# Patient Record
Sex: Female | Born: 1970
Health system: Southern US, Community
[De-identification: ages and names within clinical notes are randomized; demographics above are authoritative.]

## PROBLEM LIST (undated history)

## (undated) DIAGNOSIS — F419 Anxiety disorder, unspecified: Secondary | ICD-10-CM

## (undated) DIAGNOSIS — G43909 Migraine, unspecified, not intractable, without status migrainosus: Secondary | ICD-10-CM

## (undated) DIAGNOSIS — F32A Depression, unspecified: Secondary | ICD-10-CM

## (undated) DIAGNOSIS — M1990 Unspecified osteoarthritis, unspecified site: Secondary | ICD-10-CM

## (undated) DIAGNOSIS — F329 Major depressive disorder, single episode, unspecified: Secondary | ICD-10-CM

## (undated) DIAGNOSIS — M797 Fibromyalgia: Secondary | ICD-10-CM

## (undated) DIAGNOSIS — J45909 Unspecified asthma, uncomplicated: Secondary | ICD-10-CM

## (undated) DIAGNOSIS — J449 Chronic obstructive pulmonary disease, unspecified: Secondary | ICD-10-CM

## (undated) HISTORY — PX: CHOLECYSTECTOMY: SHX55

## (undated) HISTORY — DX: Anxiety disorder, unspecified: F41.9

## (undated) HISTORY — PX: ROUX-EN-Y GASTRIC BYPASS: SHX1104

## (undated) HISTORY — DX: Depression, unspecified: F32.A

## (undated) HISTORY — DX: Migraine, unspecified, not intractable, without status migrainosus: G43.909

---
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
## 2007-05-20 ENCOUNTER — Ambulatory Visit: Payer: Self-pay | Admitting: Orthopedic Surgery

## 2007-05-26 ENCOUNTER — Ambulatory Visit (HOSPITAL_COMMUNITY): Admission: RE | Admit: 2007-05-26 | Discharge: 2007-05-26 | Payer: Self-pay | Admitting: Orthopedic Surgery

## 2007-05-27 ENCOUNTER — Ambulatory Visit (HOSPITAL_COMMUNITY): Admission: RE | Admit: 2007-05-27 | Discharge: 2007-05-27 | Payer: Self-pay | Admitting: Orthopedic Surgery

## 2007-05-28 ENCOUNTER — Ambulatory Visit (HOSPITAL_COMMUNITY): Admission: RE | Admit: 2007-05-28 | Discharge: 2007-05-28 | Payer: Self-pay | Admitting: Orthopedic Surgery

## 2007-06-24 ENCOUNTER — Emergency Department (HOSPITAL_COMMUNITY): Admission: EM | Admit: 2007-06-24 | Discharge: 2007-06-24 | Payer: Self-pay | Admitting: Emergency Medicine

## 2007-07-06 ENCOUNTER — Ambulatory Visit: Payer: Self-pay | Admitting: Orthopedic Surgery

## 2007-08-03 ENCOUNTER — Ambulatory Visit: Payer: Self-pay | Admitting: Orthopedic Surgery

## 2015-05-04 ENCOUNTER — Emergency Department (HOSPITAL_COMMUNITY)
Admission: EM | Admit: 2015-05-04 | Discharge: 2015-05-04 | Disposition: A | Discharge status: Home / Self Care | Payer: Medicaid Other | Attending: Emergency Medicine | Admitting: Emergency Medicine

## 2015-05-04 ENCOUNTER — Encounter (HOSPITAL_COMMUNITY): Payer: Self-pay | Admitting: Emergency Medicine

## 2015-05-04 DIAGNOSIS — M542 Cervicalgia: Secondary | ICD-10-CM

## 2015-05-04 DIAGNOSIS — M5090 Cervical disc disorder, unspecified, unspecified cervical region: Secondary | ICD-10-CM | POA: Diagnosis not present

## 2015-05-04 DIAGNOSIS — Z79899 Other long term (current) drug therapy: Secondary | ICD-10-CM | POA: Diagnosis not present

## 2015-05-04 DIAGNOSIS — M5030 Other cervical disc degeneration, unspecified cervical region: Secondary | ICD-10-CM

## 2015-05-04 MED ORDER — ONDANSETRON HCL 4 MG PO TABS
4.0000 mg | ORAL_TABLET | Freq: Once | ORAL | Status: AC
  Administered 2015-05-04: 4 mg via ORAL
  Filled 2015-05-04: qty 1

## 2015-05-04 MED ORDER — NAPROXEN 500 MG PO TABS: 500.0000 mg | ORAL_TABLET | Freq: Two times a day (BID) | ORAL | Status: DC

## 2015-05-04 MED ORDER — BACLOFEN 10 MG PO TABS: 10.0000 mg | ORAL_TABLET | Freq: Three times a day (TID) | ORAL | Status: AC

## 2015-05-04 MED ORDER — NAPROXEN 250 MG PO TABS
500.0000 mg | ORAL_TABLET | Freq: Once | ORAL | Status: AC
  Administered 2015-05-04: 500 mg via ORAL
  Filled 2015-05-04: qty 2

## 2015-05-04 MED ORDER — DIAZEPAM 5 MG PO TABS
5.0000 mg | ORAL_TABLET | Freq: Once | ORAL | Status: AC
  Administered 2015-05-04: 5 mg via ORAL
  Filled 2015-05-04: qty 1

## 2015-05-04 MED ORDER — ACETAMINOPHEN-CODEINE #3 300-30 MG PO TABS: 1.0000 | ORAL_TABLET | Freq: Four times a day (QID) | ORAL | Status: DC | PRN

## 2015-05-04 MED ORDER — ACETAMINOPHEN-CODEINE #3 300-30 MG PO TABS
2.0000 | ORAL_TABLET | Freq: Once | ORAL | Status: AC
  Administered 2015-05-04: 2 via ORAL
  Filled 2015-05-04: qty 2

## 2015-05-04 MED ORDER — PREDNISONE 10 MG PO TABS
60.0000 mg | ORAL_TABLET | Freq: Once | ORAL | Status: AC
  Administered 2015-05-04: 60 mg via ORAL
  Filled 2015-05-04 (×2): qty 1

## 2015-05-04 NOTE — ED Provider Notes (Signed)
CSN: [id number]     Arrival date & time 05/04/15  1816 History   First MD Initiated Contact with Patient 05/04/15 2016     Chief Complaint  Patient presents with  . Neck Pain     (Consider location/radiation/quality/duration/timing/severity/associated sxs/prior Treatment) Patient is a [age] y.o. female presenting with neck pain. The history is provided by the patient.  Neck Pain Pain location:  Generalized neck Quality:  Aching and stiffness Stiffness is present:  All day Pain radiates to:  L shoulder Pain severity:  Severe Pain is:  Same all the time Onset quality:  Gradual Timing:  Intermittent Progression:  Worsening Chronicity: acute on chronic. Context comment:  Startted a new job , has hx of DDD of c spine and osteoarthritis Relieved by:  Nothing Exacerbated by: movement. Associated symptoms: no bladder incontinence, no bowel incontinence, no numbness, no syncope and no weakness   Risk factors: no hx of spinal trauma, no recent epidural, no recent head injury and no recurrent falls     History reviewed. No pertinent past medical history. Past Surgical History  Procedure Laterality Date  . Cesarean section    . Cholecystectomy     History reviewed. No pertinent family history. History  Substance Use Topics  . Smoking status: Never Smoker   . Smokeless tobacco: Not on file  . Alcohol Use: No   OB History    No data available     Review of Systems  Cardiovascular: Negative for syncope.  Gastrointestinal: Negative for bowel incontinence.  Genitourinary: Negative for bladder incontinence.  Musculoskeletal: Positive for back pain, arthralgias and neck pain.  Neurological: Negative for weakness and numbness.  All other systems reviewed and are negative.     Allergies  Ephedrine and Aspirin  Home Medications   Prior to Admission medications   Medication Sig Start Date End Date Taking? Authorizing Provider  naproxen sodium (ANAPROX) 220 MG tablet Take 440 mg  by mouth daily as needed (FOR PAIN).   Yes Historical Provider, MD  TURMERIC PO Take 1 capsule by mouth daily.   Yes Historical Provider, MD   BP 141/71 mmHg  Pulse 79  Temp(Src) 98.2 F (36.8 C) (Oral)  Resp 22  Ht 5\' 8"  (1.727 m)  Wt 270 lb (122.471 kg)  BMI 41.06 kg/m2  SpO2 100%  LMP 04/12/2015 Physical Exam  Constitutional: She is oriented to person, place, and time. She appears well-developed and well-nourished.  Non-toxic appearance.  HENT:  Head: Normocephalic.  Right Ear: Tympanic membrane and external ear normal.  Left Ear: Tympanic membrane and external ear normal.  Eyes: EOM and lids are normal. Pupils are equal, round, and reactive to light.  Neck: Normal range of motion. Neck supple. Carotid bruit is not present.  Cardiovascular: Normal rate, regular rhythm, normal heart sounds, intact distal pulses and normal pulses.   Pulmonary/Chest: Breath sounds normal. No respiratory distress.  Abdominal: Soft. Bowel sounds are normal. There is no tenderness. There is no guarding.  Musculoskeletal:       Cervical back: She exhibits decreased range of motion, tenderness, pain and spasm. She exhibits no deformity.  Lymphadenopathy:       Head (right side): No submandibular adenopathy present.       Head (left side): No submandibular adenopathy present.    She has no cervical adenopathy.  Neurological: She is alert and oriented to person, place, and time. She has normal strength. No cranial nerve deficit or sensory deficit.  Grip symmetrical. No muscle atrophy noted.  No gross neuro deficit.  Skin: Skin is warm and dry.  Psychiatric: She has a normal mood and affect. Her speech is normal.  Nursing note and vitals reviewed.   ED Course  Procedures (including critical care time) Labs Review Labs Reviewed - No data to display  Imaging Review No results found.   EKG Interpretation None      MDM Vital signs are within normal limits. Pulse oximetry 100% on room air. No  gross neurologic deficits appreciated. Pain to palpation over the lower cervical spine. Pain to palpation of the upper trapezius area left more than right. Patient has a history of degenerative disc disease as well as osteoarthritis involving the spine.  The plan at this time is for the patient uses heating pad to the area. Prescription for Tylenol codeine, baclofen, and Naprosyn given to the patient. Patient states she is waiting for her Medicaid, or her work benefits to be released before she can go to see an orthopedic specialist. Patient advised to see the orthopedic specialist as sone as possible.    Final diagnoses:  None    *I have reviewed nursing notes, vital signs, and all appropriate lab and imaging results for this patient.[address]**    Kamarie Palma, PA-C 05/04/15 2053  Margarita Grizzleanielle Ray, MD 05/04/15 2138

## 2015-05-04 NOTE — Discharge Instructions (Signed)
Heating pad to your neck and shoulder may be helpful. Please use medications as prescribed. Tylenol codeine, and baclofen may cause drowsiness, please do not drink alcohol, drive equal, 3 machinery, or participate in activities requiring concentration when taking this medication. Musculoskeletal Pain Musculoskeletal pain is muscle and boney aches and pains. These pains can occur in any part of the body. Your caregiver may treat you without knowing the cause of the pain. They may treat you if blood or urine tests, X-rays, and other tests were normal.  CAUSES There is often not a definite cause or reason for these pains. These pains may be caused by a type of germ (virus). The discomfort may also come from overuse. Overuse includes working out too hard when your body is not fit. Boney aches also come from weather changes. Bone is sensitive to atmospheric pressure changes. HOME CARE INSTRUCTIONS   Ask when your test results will be ready. Make sure you get your test results.  Only take over-the-counter or prescription medicines for pain, discomfort, or fever as directed by your caregiver. If you were given medications for your condition, do not drive, operate machinery or power tools, or sign legal documents for 24 hours. Do not drink alcohol. Do not take sleeping pills or other medications that may interfere with treatment.  Continue all activities unless the activities cause more pain. When the pain lessens, slowly resume normal activities. Gradually increase the intensity and duration of the activities or exercise.  During periods of severe pain, bed rest may be helpful. Lay or sit in any position that is comfortable.  Putting ice on the injured area.  Put ice in a bag.  Place a towel between your skin and the bag.  Leave the ice on for 15 to 20 minutes, 3 to 4 times a day.  Follow up with your caregiver for continued problems and no reason can be found for the pain. If the pain becomes worse  or does not go away, it may be necessary to repeat tests or do additional testing. Your caregiver may need to look further for a possible cause. SEEK IMMEDIATE MEDICAL CARE IF:  You have pain that is getting worse and is not relieved by medications.  You develop chest pain that is associated with shortness or breath, sweating, feeling sick to your stomach (nauseous), or throw up (vomit).  Your pain becomes localized to the abdomen.  You develop any new symptoms that seem different or that concern you. MAKE SURE YOU:   Understand these instructions.  Will watch your condition.  Will get help right away if you are not doing well or get worse. Document Released: 12/16/2005 Document Revised: 03/09/2012 Document Reviewed: 08/20/2013 [facility] Patient Information 2015 [place], [place]. This information is not intended to replace advice given to you by your health care provider. Make sure you discuss any questions you have with your health care provider.

## 2015-05-04 NOTE — ED Notes (Signed)
Patient complaining of neck pain since last night. States she has history of same.

## 2015-09-09 ENCOUNTER — Encounter (HOSPITAL_COMMUNITY): Payer: Self-pay | Admitting: Emergency Medicine

## 2015-09-09 ENCOUNTER — Emergency Department (HOSPITAL_COMMUNITY)
Admission: EM | Admit: 2015-09-09 | Discharge: 2015-09-09 | Disposition: A | Discharge status: Home / Self Care | Payer: Medicaid Other | Attending: Emergency Medicine | Admitting: Emergency Medicine

## 2015-09-09 DIAGNOSIS — S0501XA Injury of conjunctiva and corneal abrasion without foreign body, right eye, initial encounter: Secondary | ICD-10-CM | POA: Insufficient documentation

## 2015-09-09 DIAGNOSIS — Z79899 Other long term (current) drug therapy: Secondary | ICD-10-CM | POA: Diagnosis not present

## 2015-09-09 DIAGNOSIS — Z791 Long term (current) use of non-steroidal anti-inflammatories (NSAID): Secondary | ICD-10-CM | POA: Insufficient documentation

## 2015-09-09 DIAGNOSIS — Y9289 Other specified places as the place of occurrence of the external cause: Secondary | ICD-10-CM | POA: Insufficient documentation

## 2015-09-09 DIAGNOSIS — Y9389 Activity, other specified: Secondary | ICD-10-CM | POA: Diagnosis not present

## 2015-09-09 DIAGNOSIS — W228XXA Striking against or struck by other objects, initial encounter: Secondary | ICD-10-CM | POA: Insufficient documentation

## 2015-09-09 DIAGNOSIS — S0591XA Unspecified injury of right eye and orbit, initial encounter: Secondary | ICD-10-CM | POA: Diagnosis present

## 2015-09-09 DIAGNOSIS — Y998 Other external cause status: Secondary | ICD-10-CM | POA: Diagnosis not present

## 2015-09-09 MED ORDER — TOBRAMYCIN 0.3 % OP SOLN
2.0000 [drp] | Freq: Once | OPHTHALMIC | Status: AC
  Administered 2015-09-09: 2 [drp] via OPHTHALMIC
  Filled 2015-09-09: qty 5

## 2015-09-09 MED ORDER — TETANUS-DIPHTH-ACELL PERTUSSIS 5-2.5-18.5 LF-MCG/0.5 IM SUSP
0.5000 mL | Freq: Once | INTRAMUSCULAR | Status: AC
  Administered 2015-09-09: 0.5 mL via INTRAMUSCULAR
  Filled 2015-09-09: qty 0.5

## 2015-09-09 MED ORDER — TETRACAINE HCL 0.5 % OP SOLN
2.0000 [drp] | Freq: Once | OPHTHALMIC | Status: AC
  Administered 2015-09-09: 2 [drp] via OPHTHALMIC
  Filled 2015-09-09: qty 2

## 2015-09-09 MED ORDER — FLUORESCEIN SODIUM 1 MG OP STRP
1.0000 | ORAL_STRIP | Freq: Once | OPHTHALMIC | Status: AC
  Administered 2015-09-09: 1 via OPHTHALMIC

## 2015-09-09 NOTE — ED Notes (Signed)
PA at bedside.

## 2015-09-09 NOTE — ED Notes (Addendum)
Reports of unknown object going into right eye about 30 minutes ago. States someone was showing her how to use leaf blower during time of injury. Redness noted to right eye. Denies blurry vision.

## 2015-09-09 NOTE — ED Notes (Signed)
Pt states something was blown into her eye with a leaf blower and it still feels "irritated".

## 2015-09-09 NOTE — Discharge Instructions (Signed)
Please use  2 tobramycin eyedrops every 4 hours to the right eye for the next 5 days. Please use cold compresses. Please use dark glasses and a hat with brim until the discomfort has subsided. Please see your eye specialist as scheduled. Please adjust your medical records that you received tetanus update today. Corneal Abrasion The cornea is the clear covering at the front and center of the eye. When you look at the colored portion of the eye, you are looking through the cornea. It is a thin tissue made up of layers. The top layer is the most sensitive layer. A corneal abrasion happens if this layer is scratched or an injury causes it to come off.  HOME CARE  You may be given drops or a medicated cream. Use the medicine as told by your doctor.  A pressure patch may be put over the eye. If this is done, follow your doctor's instructions for when to remove the patch. Do not drive or use machines while the eye patch is on. Judging distances is hard to do with a patch on.  See your doctor for a follow-up exam if you are told to do so. It is very important that you keep this appointment. GET HELP IF:   You have pain, are sensitive to light, and have a scratchy feeling in one eye or both eyes.  Your pressure patch keeps getting loose. You can blink your eye under the patch.  You have fluid coming from your eye or the lids stick together in the morning.  You have the same symptoms in the morning that you did with the first abrasion. This could be days, weeks, or months after the first abrasion healed. MAKE SURE YOU:   Understand these instructions.  Will watch your condition.  Will get help right away if you are not doing well or get worse. Document Released: 06/03/2008 Document Revised: 10/06/2013 Document Reviewed: 08/23/2013 [facility] Patient Information 2015 [place], [place]. This information is not intended to replace advice given to you by your health care provider. Make sure you discuss any  questions you have with your health care provider.

## 2015-09-09 NOTE — ED Provider Notes (Signed)
CSN: [id number]     Arrival date & time 09/09/15  2042 History   First MD Initiated Contact with Patient 09/09/15 2050     Chief Complaint  Patient presents with  . Eye Pain     (Consider location/radiation/quality/duration/timing/severity/associated sxs/prior Treatment) HPI Comments: Someone turned on a leaf blower near the patient and a foreign object blew into the right eye. Pt flushed the eye, but continues to have a foreign body sensation in the eye. There is also some pain present. Pt not wearing contacts. No hx of glaucoma. No bleeding from the eye. No bluring or decreased vision. Pt acknowledges that she needs to see an eye specialist concerning her vision.  The history is provided by the patient.    History reviewed. No pertinent past medical history. Past Surgical History  Procedure Laterality Date  . Cesarean section    . Cholecystectomy     History reviewed. No pertinent family history. Social History  Substance Use Topics  . Smoking status: Never Smoker   . Smokeless tobacco: None  . Alcohol Use: No   OB History    No data available     Review of Systems  Constitutional: Negative for activity change.       All ROS Neg except as noted in HPI  HENT: Negative for nosebleeds.   Eyes: Positive for photophobia, pain and redness. Negative for discharge.  Respiratory: Negative for cough, shortness of breath and wheezing.   Cardiovascular: Negative for chest pain and palpitations.  Gastrointestinal: Negative for abdominal pain and blood in stool.  Genitourinary: Negative for dysuria, frequency and hematuria.  Musculoskeletal: Negative for back pain, arthralgias and neck pain.  Skin: Negative.   Neurological: Negative for dizziness, seizures and speech difficulty.  Psychiatric/Behavioral: Negative for hallucinations and confusion.      Allergies  Ephedrine and Aspirin  Home Medications   Prior to Admission medications   Medication Sig Start Date End Date  Taking? Authorizing Provider  acetaminophen-codeine (TYLENOL #3) 300-30 MG per tablet Take 1-2 tablets by mouth every 6 (six) hours as needed. 05/04/15   Ivery Quale, PA-C  naproxen (NAPROSYN) 500 MG tablet Take 1 tablet (500 mg total) by mouth 2 (two) times daily. 05/04/15   Ivery Quale, PA-C  naproxen sodium (ANAPROX) 220 MG tablet Take 440 mg by mouth daily as needed (FOR PAIN).    Historical Provider, MD  TURMERIC PO Take 1 capsule by mouth daily.    Historical Provider, MD   BP 125/92 mmHg  Pulse 69  Temp(Src) 97.9 F (36.6 C)  Resp 18  Ht 5\' 7"  (1.702 m)  Wt 295 lb (133.811 kg)  BMI 46.19 kg/m2  SpO2 100% Physical Exam  Constitutional: She is oriented to person, place, and time. She appears well-developed and well-nourished.  Non-toxic appearance.  HENT:  Head: Normocephalic.  Right Ear: Tympanic membrane and external ear normal.  Left Ear: Tympanic membrane and external ear normal.  Eyes: EOM and lids are normal. Pupils are equal, round, and reactive to light. Right eye exhibits no discharge and no hordeolum. No foreign body present in the right eye. Left eye exhibits no discharge and no hordeolum. No foreign body present in the left eye. Right conjunctiva is injected. Left conjunctiva is not injected. No scleral icterus.  Fundoscopic exam:      The right eye shows no exudate, no hemorrhage and no papilledema.       The left eye shows no exudate, no hemorrhage and no papilledema.  Slit  lamp exam:      The right eye shows corneal abrasion. The right eye shows no corneal ulcer, no foreign body, no hyphema and no anterior chamber bulge.    Neck: Normal range of motion. Neck supple. Carotid bruit is not present.  Cardiovascular: Normal rate, regular rhythm, normal heart sounds, intact distal pulses and normal pulses.   Pulmonary/Chest: Breath sounds normal. No respiratory distress.  Abdominal: Soft. Bowel sounds are normal. There is no tenderness. There is no guarding.   Musculoskeletal: Normal range of motion.  Lymphadenopathy:       Head (right side): No submandibular adenopathy present.       Head (left side): No submandibular adenopathy present.    She has no cervical adenopathy.  Neurological: She is alert and oriented to person, place, and time. She has normal strength. No cranial nerve deficit or sensory deficit.  Skin: Skin is warm and dry.  Psychiatric: She has a normal mood and affect. Her speech is normal.  Nursing note and vitals reviewed.   ED Course  Procedures (including critical care time) Labs Review Labs Reviewed - No data to display  Imaging Review No results found. I have personally reviewed and evaluated these images and lab results as part of my medical decision-making.   EKG Interpretation None      MDM  There is a shallow abrasion present at the 2:00 position. The patient will be treated with tobramycin drops. The tetanus status was updated. Patient was advised to use cool compresses over the next few days. She will use the tobramycin every 4 hours for the next 5 days. She is scheduled to see her eye specialist this week.    Final diagnoses:  None    **I have reviewed nursing notes, vital signs, and all appropriate lab and imaging results for this patient.Ivery Quale, PA-C 09/09/15 2140  Ivery Quale, PA-C 09/15/15 2239  Vanetta Mulders, MD 09/20/15 2055

## 2016-09-14 ENCOUNTER — Emergency Department (HOSPITAL_COMMUNITY): Payer: Medicaid Other

## 2016-09-14 ENCOUNTER — Emergency Department (HOSPITAL_COMMUNITY)
Admission: EM | Admit: 2016-09-14 | Discharge: 2016-09-14 | Disposition: A | Discharge status: Home / Self Care | Payer: Medicaid Other | Attending: Emergency Medicine | Admitting: Emergency Medicine

## 2016-09-14 ENCOUNTER — Encounter (HOSPITAL_COMMUNITY): Payer: Self-pay | Admitting: Emergency Medicine

## 2016-09-14 DIAGNOSIS — S93401A Sprain of unspecified ligament of right ankle, initial encounter: Secondary | ICD-10-CM | POA: Insufficient documentation

## 2016-09-14 DIAGNOSIS — Y939 Activity, unspecified: Secondary | ICD-10-CM | POA: Insufficient documentation

## 2016-09-14 DIAGNOSIS — S63501A Unspecified sprain of right wrist, initial encounter: Secondary | ICD-10-CM

## 2016-09-14 DIAGNOSIS — Y929 Unspecified place or not applicable: Secondary | ICD-10-CM | POA: Insufficient documentation

## 2016-09-14 DIAGNOSIS — S8001XA Contusion of right knee, initial encounter: Secondary | ICD-10-CM | POA: Insufficient documentation

## 2016-09-14 DIAGNOSIS — W1809XA Striking against other object with subsequent fall, initial encounter: Secondary | ICD-10-CM | POA: Insufficient documentation

## 2016-09-14 DIAGNOSIS — Y999 Unspecified external cause status: Secondary | ICD-10-CM | POA: Insufficient documentation

## 2016-09-14 MED ORDER — FOLDING WALKER MISC: 1.0000 | Freq: Once | 0 refills | Status: AC

## 2016-09-14 NOTE — Discharge Instructions (Signed)
Wear your ASO and use the walker to help minimize weight bearing.  Use ice and elevation as much as possible for the next several days for your ankle, knee and wrist to help reduce the swelling.   Use your naproxen also for inflammation.  Call the orthopedic doctor listed for a recheck of your injury in the next 10 days if not improving (or the doctor referred by your employer).   You may benefit from physical therapy if your injuries are not improving over the next 7-10 days.

## 2016-09-14 NOTE — ED Triage Notes (Addendum)
PT states her right ankle rolled this am causing her to fall to the right side. PT c/o right knee and ankle pain since fall at 1030 this am. PT also c/o right hand pain as well on ROM.

## 2016-09-15 NOTE — ED Provider Notes (Signed)
[facility] DEPT Provider Note   CSN: [id number] Arrival date & time: 09/14/16  1155     History   Chief Complaint Chief Complaint  Patient presents with  . Fall    HPI Delta Pichon is a 45 y.o. female.  The history is provided by the patient.  Fall  This is a new problem. 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 The problem occurs constantly. The problem has been gradually worsening. Pertinent negatives include no chest pain, no abdominal pain and no headaches. Associated symptoms comments: She denies head injury, headache, neck or back pain.. The symptoms are aggravated by standing. Nothing relieves the symptoms. She has tried nothing for the symptoms.    History reviewed. No pertinent past medical history.  There are no active problems to display for this patient.   Past Surgical History:  Procedure Laterality Date  . CESAREAN SECTION    . CHOLECYSTECTOMY      OB History    No data available       Home Medications    Prior to Admission medications   Medication Sig Start Date End Date Taking? Authorizing Provider  acetaminophen-codeine (TYLENOL #3) 300-30 MG per tablet Take 1-2 tablets by mouth every 6 (six) hours as needed. Patient not taking: Reported on 09/14/2016 05/04/15   Ivery Quale, PA-C  naproxen (NAPROSYN) 500 MG tablet Take 1 tablet (500 mg total) by mouth 2 (two) times daily. Patient not taking: Reported on 09/14/2016 05/04/15   Ivery Quale, PA-C  naproxen sodium (ANAPROX) 220 MG tablet Take 440 mg by mouth daily as needed (FOR PAIN).    Historical Provider, MD    Family History History reviewed. No pertinent family history.  Social History Social History  Substance Use Topics  . Smoking status: Never Smoker  . Smokeless tobacco: Never Used  . Alcohol use No     Allergies   Ephedrine and Aspirin   Review  of Systems Review of Systems  Constitutional: Negative for fever.  Cardiovascular: Negative for chest pain.  Gastrointestinal: Negative for abdominal pain.  Musculoskeletal: Positive for arthralgias and joint swelling. Negative for myalgias.  Neurological: Negative for weakness, numbness and headaches.     Physical Exam Updated Vital Signs BP 150/88 (BP Location: Right Wrist)   Pulse 85   Temp 98.7 F (37.1 C) (Oral)   Resp 20   Ht 5\' 7"  (1.702 m)   Wt 133.8 kg   LMP 09/01/2016   SpO2 100%   BMI 46.20 kg/m   Physical Exam  Constitutional: She appears well-developed and well-nourished.  HENT:  Head: Normocephalic.  Cardiovascular: Normal rate and intact distal pulses.  Exam reveals no decreased pulses.   Pulses:      Dorsalis pedis pulses are 2+ on the right side, and 2+ on the left side.       Posterior tibial pulses are 2+ on the right side, and 2+ on the left side.  Musculoskeletal: She exhibits edema and tenderness.       Right knee: She exhibits normal range of motion, no swelling, no effusion, no ecchymosis, no deformity, normal alignment, no LCL laxity, normal meniscus and no MCL laxity. Tenderness found. Lateral joint line tenderness noted.       Right ankle: She exhibits decreased range of motion and swelling. She exhibits no ecchymosis and normal pulse. Tenderness. Lateral malleolus tenderness found.  No head of 5th metatarsal and no proximal fibula tenderness found. Achilles tendon normal.       Right hand: She exhibits bony tenderness. She exhibits normal range of motion, normal capillary refill and no deformity. Normal sensation noted.       Hands: No snuffbox tenderness.  Neurological: She is alert. No sensory deficit.  Skin: Skin is warm, dry and intact.  Nursing note and vitals reviewed.    ED Treatments / Results  Labs (all labs ordered are listed, but only abnormal results are displayed) Labs Reviewed - No data to display  EKG  EKG  Interpretation None       Radiology Dg Ankle Complete Right  Result Date: 09/14/2016 CLINICAL DATA:  Patient status post fall. Ankle pain. Initial encounter. EXAM: RIGHT ANKLE - COMPLETE 3+ VIEW COMPARISON:  None. FINDINGS: Normal anatomic alignment. No evidence for acute fracture or dislocation. Plantar calcaneal spurring. Regional soft tissues are unremarkable. Focal areas of osseous sclerosis within the distal fibula. IMPRESSION: No acute osseous abnormality. Focal area of osseous sclerosis within the distal fibula, potentially representing a large bone island. Recommend follow-up ankle radiograph in 6 months to ensure stability. Electronically Signed   By: Annia Beltrew  Davis M.D.   On: 09/14/2016 13:07   Dg Knee Complete 4 Views Right  Result Date: 09/14/2016 CLINICAL DATA:  Patient status post fall. Right ankle and knee pain. No prior injury. Initial encounter. EXAM: RIGHT KNEE - COMPLETE 4+ VIEW COMPARISON:  None. FINDINGS: Normal anatomic alignment. No evidence for acute fracture or dislocation. Regional soft tissues are unremarkable. No joint effusion. IMPRESSION: No acute osseous abnormality. Electronically Signed   By: Annia Beltrew  Davis M.D.   On: 09/14/2016 13:03   Dg Hand Complete Right  Result Date: 09/14/2016 CLINICAL DATA:  Patient status post fall.  Right hand pain. EXAM: RIGHT HAND - COMPLETE 3+ VIEW COMPARISON:  None. FINDINGS: Normal anatomic alignment. No evidence for acute fracture dislocation. Regional soft tissues are unremarkable. IMPRESSION: No acute osseous abnormality. Electronically Signed   By: Annia Beltrew  Davis M.D.   On: 09/14/2016 13:05    Procedures Procedures (including critical care time)  Medications Ordered in ED Medications - No data to display   Initial Impression / Assessment and Plan / ED Course  I have reviewed the triage vital signs and the nursing notes.  Pertinent labs & imaging results that were available during my care of the patient were reviewed by me and  considered in my medical decision making (see chart for details).  Clinical Course    Pt placed in aso, velcro wrist splint. RICE, prn walker prescribed. Plan f/u with ortho in 10 days if not improving, or workers comp MD since this was a work related injury.  Final Clinical Impressions(s) / ED Diagnoses   Final diagnoses:  Ankle sprain, right, initial encounter  Wrist sprain, right, initial encounter  Knee contusion, right, initial encounter    New Prescriptions Discharge Medication List as of 09/14/2016  1:25 PM    START taking these medications   Details  Misc. Devices [facility] [place] MISC 1 Device by Does not apply route once., Starting Sat 09/14/2016, Print         Burgess AmorJulie Lovina Zuver, PA-C 09/15/16 [id number]    Vanetta MuldersScott Zackowski, MD 09/15/16 2127

## 2017-07-03 ENCOUNTER — Emergency Department (HOSPITAL_COMMUNITY)
Admission: EM | Admit: 2017-07-03 | Discharge: 2017-07-04 | Disposition: A | Discharge status: Home / Self Care | Payer: Medicaid Other | Attending: Emergency Medicine | Admitting: Emergency Medicine

## 2017-07-03 ENCOUNTER — Encounter (HOSPITAL_COMMUNITY): Payer: Self-pay | Admitting: Emergency Medicine

## 2017-07-03 ENCOUNTER — Emergency Department (HOSPITAL_COMMUNITY): Payer: Medicaid Other

## 2017-07-03 DIAGNOSIS — W010XXA Fall on same level from slipping, tripping and stumbling without subsequent striking against object, initial encounter: Secondary | ICD-10-CM | POA: Insufficient documentation

## 2017-07-03 DIAGNOSIS — W19XXXA Unspecified fall, initial encounter: Secondary | ICD-10-CM

## 2017-07-03 DIAGNOSIS — M79604 Pain in right leg: Secondary | ICD-10-CM | POA: Insufficient documentation

## 2017-07-03 DIAGNOSIS — Y999 Unspecified external cause status: Secondary | ICD-10-CM | POA: Insufficient documentation

## 2017-07-03 DIAGNOSIS — Y9301 Activity, walking, marching and hiking: Secondary | ICD-10-CM | POA: Insufficient documentation

## 2017-07-03 DIAGNOSIS — Y9289 Other specified places as the place of occurrence of the external cause: Secondary | ICD-10-CM | POA: Insufficient documentation

## 2017-07-03 NOTE — ED Triage Notes (Addendum)
Pt c/o right knee down to right ankle pain since fall Friday. Pt is ambulatory in triage

## 2017-07-04 NOTE — Discharge Instructions (Signed)
Apply Ace wrap and the immobilizer as needed for pain and swelling. Elevate the extremity or use compression stockings when possible. Alternate 600 mg of ibuprofen and 500-1000mg  of Tylenol every 3 hours as needed for pain for the next few days. Apply ice or heat to the affected area for comfort. Follow-up with your primary care physician for reevaluation. Return to the ED if any concerning signs or symptoms develop such as worsening swelling, redness, severe pain.

## 2017-07-04 NOTE — ED Provider Notes (Signed)
[facility] DEPT Provider Note   CSN: [id number] Arrival date & time: 07/03/17  2002     History   Chief Complaint Chief Complaint  Patient presents with  . Leg Pain    HPI  Heather Mckinney is a 46 y.o. female who presents today with chief complaint acute onset, persisting right lower extremity pain for 6 days secondary to fall. Patient states that on Friday she misstepped in a parking lot and fell on her bilateral knees. Denies head injury or loss of consciousness. Since then she has had swelling, ecchymosis, and pain to the anterior aspect of the right knee radiating down the right shin into the ankle and toes. She notes pain is aching and throbbing, worsens with standing, palpation, and bending. She endorses intermittent numbness and tingling into her toes and the dorsum of her foot. She has been taking Aleve with mild relief.  The history is provided by the patient.    History reviewed. No pertinent past medical history.  There are no active problems to display for this patient.   Past Surgical History:  Procedure Laterality Date  . CESAREAN SECTION    . CHOLECYSTECTOMY      OB History    No data available       Home Medications    Prior to Admission medications   Medication Sig Start Date End Date Taking? Authorizing Provider  acetaminophen-codeine (TYLENOL #3) 300-30 MG per tablet Take 1-2 tablets by mouth every 6 (six) hours as needed. Patient not taking: Reported on 09/14/2016 05/04/15   Ivery Quale, PA-C  naproxen (NAPROSYN) 500 MG tablet Take 1 tablet (500 mg total) by mouth 2 (two) times daily. Patient not taking: Reported on 09/14/2016 05/04/15   Ivery Quale, PA-C  naproxen sodium (ANAPROX) 220 MG tablet Take 440 mg by mouth daily as needed (FOR PAIN).    [provider]    Family History History reviewed. No pertinent family history.  Social History Social History  Substance Use Topics  . Smoking status: Never Smoker  . Smokeless  tobacco: Never Used  . Alcohol use No     Allergies   Ephedrine and Aspirin   Review of Systems Review of Systems  Constitutional: Negative for chills and fever.  Musculoskeletal: Positive for arthralgias, joint swelling and myalgias. Negative for back pain and neck pain.  Neurological: Positive for numbness. Negative for syncope and weakness.     Physical Exam Updated Vital Signs BP (!) 155/94 (BP Location: Right Wrist)   Pulse 79   Temp 97.7 F (36.5 C) (Oral)   Resp 20   Ht 5\' 7"  (1.702 m)   Wt 135.2 kg (298 lb)   LMP 06/11/2017   SpO2 98%   BMI 46.67 kg/m   Physical Exam  Constitutional: She is oriented to person, place, and time. She appears well-developed and well-nourished.  HENT:  Head: Normocephalic and atraumatic.  Eyes: Conjunctivae and EOM are normal. Pupils are equal, round, and reactive to light. Right eye exhibits no discharge. Left eye exhibits no discharge.  Neck: Normal range of motion. Neck supple. No JVD present. No tracheal deviation present.  Cardiovascular: Normal rate and intact distal pulses.   DP/PT pulses of the right foot detected via Doppler. 2+ DP/PT pulses of the left foot. Negative Homans bilaterally, no palpable cords or erythema or swelling posteriorly  Pulmonary/Chest: Effort normal.  Abdominal: Soft. She exhibits no distension. There is no tenderness.  Musculoskeletal: She exhibits edema and tenderness.  Right knee: She exhibits decreased range of motion, swelling and ecchymosis. She exhibits no deformity, no laceration, no erythema, normal alignment, no LCL laxity, normal meniscus and no MCL laxity. Tenderness found. LCL tenderness noted. No medial joint line, no lateral joint line and no MCL tenderness noted.       Right ankle: She exhibits decreased range of motion, swelling and ecchymosis. She exhibits no deformity, no laceration and normal pulse. Tenderness. Lateral malleolus and medial malleolus tenderness found. Achilles  tendon exhibits no pain.  Large areas of ecchymosis noted to the right knee, anterior shin, and ankle inferior to the malleoli. There is swelling of the right ankle laterally. 5/5 strength of BLE major muscle groups, however pain elicited with flexion and extension of the ankle and knee. Diffusely tender to palpation from the knee down to the dorsum of the foot.  Neurological: She is alert and oriented to person, place, and time.  Fluent speech, no facial droop, sensation intact to light touch of bilateral lower extremities. Antalgic gait.  Skin: Skin is warm and dry. Capillary refill takes less than 2 seconds.  Psychiatric: She has a normal mood and affect. Her behavior is normal.     ED Treatments / Results  Labs (all labs ordered are listed, but only abnormal results are displayed) Labs Reviewed - No data to display  EKG  EKG Interpretation None       Radiology Dg Tibia/fibula Right  Result Date: 07/03/2017 CLINICAL DATA:  Right knee, lower leg and ankle pain after fall 6 days prior. Bruising. EXAM: RIGHT TIBIA AND FIBULA - 2 VIEW COMPARISON:  Ankle radiograph 09/14/2016 FINDINGS: There is no evidence of acute fracture. Cortical margins of the tibia and fibula are intact. Sclerotic density in the distal fibula may be a bone island or sequela of nonossifying fibroma, stable from prior exam. Soft tissue edema of the lower leg versus habitus. IMPRESSION: No acute fracture of the right lower leg. Electronically Signed   By: Rubye OaksMelanie  Ehinger M.D.   On: 07/03/2017 21:25   Dg Ankle Complete Right  Result Date: 07/03/2017 CLINICAL DATA:  Right knee, lower leg and ankle pain after fall 6 days prior. Bruising. EXAM: RIGHT ANKLE - COMPLETE 3+ VIEW COMPARISON:  Ankle radiographs 09/14/2016 FINDINGS: There is no evidence of fracture, dislocation, or joint effusion. Sclerotic density in the distal fibula is unchanged from prior exam. Ankle mortise is preserved. Soft tissue edema versus habitus.  IMPRESSION: 1. No fracture dislocation of a right ankle. 2. Stable sclerotic density in the distal fibula, may be a bone island or sequela of nonossifying fibroma. This is stable from prior exam. No further follow-up is needed. Electronically Signed   By: Rubye OaksMelanie  Ehinger M.D.   On: 07/03/2017 21:26   Dg Knee Complete 4 Views Right  Result Date: 07/03/2017 CLINICAL DATA:  Right knee, lower leg and ankle pain after fall 6 days prior. Bruising. EXAM: RIGHT KNEE - COMPLETE 4+ VIEW COMPARISON:  Right knee radiographs 09/14/2016 FINDINGS: No fracture or dislocation. Small knee joint effusion. Mild tricompartmental osteoarthritis with peripheral spurring and spurring the tibial spines. Alignment is maintained. Soft tissue edema versus habitus. IMPRESSION: No fracture or dislocation of the right knee. Mild tricompartmental osteoarthritis, unchanged from prior exam. Small joint effusion. Electronically Signed   By: Rubye OaksMelanie  Ehinger M.D.   On: 07/03/2017 21:23    Procedures Procedures (including critical care time)  Medications Ordered in ED Medications - No data to display   Initial Impression / Assessment and  Plan / ED Course  I have reviewed the triage vital signs and the nursing notes.  Pertinent labs & imaging results that were available during my care of the patient were reviewed by me and considered in my medical decision making (see chart for details).     Patient with ongoing pain, ecchymosis, and swelling secondary to fall 6 days ago. Neurovascularly intact. Imaging reviewed by me shows no acute fracture or or dislocation but do show stable arthritic changes and a small joint effusion in the right knee. Low suspicion of DVT in the absence of risk factors. Suspect patient has a deep contusion, worsened with long hours standing while at work. RICE therapy indicated and discussed, applied Ace wrap to ankle and compression stockings and discussed symptomatic pain management with ibuprofen and  Tylenol. Patient is ambulatory but with pain. Recommend follow-up with primary care or orthopedics in one week if symptoms persist. Discussed indications for return to the ED immediately. Pt verbalized understanding of and agreement with plan and is safe for discharge home at this time.  Final Clinical Impressions(s) / ED Diagnoses   Final diagnoses:  Fall, initial encounter  Pain of right anterior lower extremity    New Prescriptions Discharge Medication List as of 07/04/2017 12:31 AM       Jeanie Sewer, PA-C 07/04/17 1619    Glynn Octave, MD 07/04/17 2040

## 2017-09-12 ENCOUNTER — Emergency Department (HOSPITAL_COMMUNITY)
Admission: EM | Admit: 2017-09-12 | Discharge: 2017-09-12 | Disposition: A | Discharge status: Home / Self Care | Payer: Self-pay | Attending: Emergency Medicine | Admitting: Emergency Medicine

## 2017-09-12 ENCOUNTER — Emergency Department (HOSPITAL_COMMUNITY): Payer: Self-pay

## 2017-09-12 ENCOUNTER — Encounter (HOSPITAL_COMMUNITY): Payer: Self-pay | Admitting: Emergency Medicine

## 2017-09-12 DIAGNOSIS — J449 Chronic obstructive pulmonary disease, unspecified: Secondary | ICD-10-CM | POA: Insufficient documentation

## 2017-09-12 DIAGNOSIS — J209 Acute bronchitis, unspecified: Secondary | ICD-10-CM | POA: Insufficient documentation

## 2017-09-12 HISTORY — DX: Chronic obstructive pulmonary disease, unspecified: J44.9

## 2017-09-12 MED ORDER — PREDNISONE 10 MG PO TABS: 20.0000 mg | ORAL_TABLET | Freq: Every day | ORAL | 0 refills | Status: DC

## 2017-09-12 MED ORDER — IPRATROPIUM-ALBUTEROL 0.5-2.5 (3) MG/3ML IN SOLN
3.0000 mL | Freq: Once | RESPIRATORY_TRACT | Status: AC
  Administered 2017-09-12: 3 mL via RESPIRATORY_TRACT
  Filled 2017-09-12: qty 3

## 2017-09-12 MED ORDER — PREDNISONE 50 MG PO TABS
60.0000 mg | ORAL_TABLET | Freq: Once | ORAL | Status: AC
  Administered 2017-09-12: 60 mg via ORAL
  Filled 2017-09-12: qty 1

## 2017-09-12 MED ORDER — ALBUTEROL SULFATE HFA 108 (90 BASE) MCG/ACT IN AERS: 2.0000 | INHALATION_SPRAY | RESPIRATORY_TRACT | 1 refills | Status: DC | PRN

## 2017-09-12 MED ORDER — AZITHROMYCIN 250 MG PO TABS: 250.0000 mg | ORAL_TABLET | Freq: Every day | ORAL | 0 refills | Status: DC

## 2017-09-12 NOTE — ED Provider Notes (Signed)
[facility] Provider Note   CSN: [id number] Arrival date & time: 09/12/17  1222     History   Chief Complaint Chief Complaint  Patient presents with  . Cough    HPI See Beharry is a 46 y.o. female.  The history is provided by the patient.  Cough  This is a new problem. The current episode started more than 2 days ago. The problem occurs constantly. The problem has been gradually worsening. The cough is productive of sputum. There has been no fever. Associated symptoms include shortness of breath. She has tried decongestants for the symptoms. She is not a smoker. Her past medical history is significant for bronchitis.  Pt complains of a cough and congestion.  Pt has had bronchitis in the past  Past Medical History:  Diagnosis Date  . COPD (chronic obstructive pulmonary disease) (HCC)     There are no active problems to display for this patient.   Past Surgical History:  Procedure Laterality Date  . CESAREAN SECTION    . CHOLECYSTECTOMY      OB History    No data available       Home Medications    Prior to Admission medications   Medication Sig Start Date End Date Taking? Authorizing Provider  naproxen sodium (ANAPROX) 220 MG tablet Take 440 mg by mouth daily as needed (FOR PAIN).   Yes [provider]  albuterol (PROVENTIL HFA;VENTOLIN HFA) 108 (90 Base) MCG/ACT inhaler Inhale 2 puffs into the lungs every 4 (four) hours as needed for wheezing or shortness of breath. 09/12/17   Elson Areas, PA-C  azithromycin (ZITHROMAX) 250 MG tablet Take 1 tablet (250 mg total) by mouth daily. Take first 2 tablets together, then 1 every day until finished. 09/12/17   Elson Areas, PA-C  predniSONE (DELTASONE) 10 MG tablet Take 2 tablets (20 mg total) by mouth daily. 09/12/17   Elson Areas, PA-C    Family History No family history on file.  Social History Social History  Substance Use Topics  . Smoking status: Never Smoker  . Smokeless tobacco:  Never Used  . Alcohol use No     Allergies   Ephedrine and Aspirin   Review of Systems Review of Systems  Respiratory: Positive for cough and shortness of breath.   All other systems reviewed and are negative.    Physical Exam Updated Vital Signs BP 122/89 (BP Location: Right Arm)   Pulse 78   Temp 98.4 F (36.9 C) (Oral)   Resp 20   Ht  (1.702 m)   Wt 135.2 kg (298 lb)   LMP 09/03/2017   SpO2 98%   BMI 46.67 kg/m   Physical Exam  Constitutional: She is oriented to person, place, and time. She appears well-developed and well-nourished.  HENT:  Head: Normocephalic.  Right Ear: External ear normal.  Left Ear: External ear normal.  Eyes: EOM are normal.  Neck: Normal range of motion.  Cardiovascular: Normal rate and regular rhythm.   Pulmonary/Chest: Effort normal.  Abdominal: She exhibits no distension.  Musculoskeletal: Normal range of motion.  Neurological: She is alert and oriented to person, place, and time.  Skin: Skin is warm.  Psychiatric: She has a normal mood and affect.  Nursing note and vitals reviewed.    ED Treatments / Results  Labs (all labs ordered are listed, but only abnormal results are displayed) Labs Reviewed - No data to display  EKG  EKG Interpretation None  Radiology Dg Chest 2 View  Result Date: 09/12/2017 CLINICAL DATA:  Cough and congestion 2 weeks with wheezing and shortness of breath past 3-4 days. EXAM: CHEST  2 VIEW COMPARISON:  None. FINDINGS: Lungs are adequately inflated without evidence of effusion, focal airspace consolidation or pneumothorax. Cardiomediastinal silhouette is within normal. Bones and soft tissues are normal. IMPRESSION: No active cardiopulmonary disease. Electronically Signed   By: Elberta Fortis M.D.   On: 09/12/2017 12:45    Procedures Procedures (including critical care time)  Medications Ordered in ED Medications  ipratropium-albuterol (DUONEB) 0.5-2.5 (3) MG/3ML nebulizer solution 3  mL (3 mLs Nebulization Given 09/12/17 1350)  predniSONE (DELTASONE) tablet 60 mg (60 mg Oral Given 09/12/17 1334)     Initial Impression / Assessment and Plan / ED Course  I have reviewed the triage vital signs and the nursing notes.  Pertinent labs & imaging results that were available during my care of the patient were reviewed by me and considered in my medical decision making (see chart for details).     Pt feels better after albuterol neb treatment.  Pt given prednisone 60 mg po here.   Final Clinical Impressions(s) / ED Diagnoses   Final diagnoses:  Acute bronchitis, unspecified organism    New Prescriptions Discharge Medication List as of 09/12/2017  2:14 PM    START taking these medications   Details  albuterol (PROVENTIL HFA;VENTOLIN HFA) 108 (90 Base) MCG/ACT inhaler Inhale 2 puffs into the lungs every 4 (four) hours as needed for wheezing or shortness of breath., Starting Fri 09/12/2017, Print    azithromycin (ZITHROMAX) 250 MG tablet Take 1 tablet (250 mg total) by mouth daily. Take first 2 tablets together, then 1 every day until finished., Starting Fri 09/12/2017, Print    predniSONE (DELTASONE) 10 MG tablet Take 2 tablets (20 mg total) by mouth daily., Starting Fri 09/12/2017, Print      An After Visit Summary was printed and given to the patient.   Elson Areas, [place] 09/12/17 1613    Loren Racer, MD 09/16/17 [phone]

## 2017-09-12 NOTE — Discharge Instructions (Signed)
Return if any problems.

## 2017-09-12 NOTE — ED Triage Notes (Signed)
Pt c/o cough/congestion and generalized body aches x 3 days.

## 2017-10-23 ENCOUNTER — Emergency Department (HOSPITAL_COMMUNITY): Payer: Self-pay

## 2017-10-23 ENCOUNTER — Emergency Department (HOSPITAL_COMMUNITY)
Admission: EM | Admit: 2017-10-23 | Discharge: 2017-10-23 | Disposition: A | Discharge status: Home / Self Care | Payer: Self-pay | Attending: Emergency Medicine | Admitting: Emergency Medicine

## 2017-10-23 ENCOUNTER — Other Ambulatory Visit: Payer: Self-pay

## 2017-10-23 ENCOUNTER — Encounter (HOSPITAL_COMMUNITY): Payer: Self-pay | Admitting: Emergency Medicine

## 2017-10-23 DIAGNOSIS — R600 Localized edema: Secondary | ICD-10-CM | POA: Insufficient documentation

## 2017-10-23 DIAGNOSIS — J449 Chronic obstructive pulmonary disease, unspecified: Secondary | ICD-10-CM | POA: Insufficient documentation

## 2017-10-23 DIAGNOSIS — R05 Cough: Secondary | ICD-10-CM

## 2017-10-23 DIAGNOSIS — R059 Cough, unspecified: Secondary | ICD-10-CM

## 2017-10-23 DIAGNOSIS — J209 Acute bronchitis, unspecified: Secondary | ICD-10-CM | POA: Insufficient documentation

## 2017-10-23 DIAGNOSIS — Z79899 Other long term (current) drug therapy: Secondary | ICD-10-CM | POA: Insufficient documentation

## 2017-10-23 DIAGNOSIS — J219 Acute bronchiolitis, unspecified: Secondary | ICD-10-CM | POA: Insufficient documentation

## 2017-10-23 LAB — CBC WITH DIFFERENTIAL/PLATELET
Basophils Absolute: 0 10*3/uL (ref 0.0–0.1)
Basophils Relative: 0 %
EOS ABS: 0.8 10*3/uL — AB (ref 0.0–0.7)
EOS PCT: 8 %
HCT: 39.3 % (ref 36.0–46.0)
HEMOGLOBIN: 12.9 g/dL (ref 12.0–15.0)
LYMPHS ABS: 2.3 10*3/uL (ref 0.7–4.0)
LYMPHS PCT: 23 %
MCH: 30.4 pg (ref 26.0–34.0)
MCHC: 32.8 g/dL (ref 30.0–36.0)
MCV: 92.5 fL (ref 78.0–100.0)
MONOS PCT: 7 %
Monocytes Absolute: 0.7 10*3/uL (ref 0.1–1.0)
Neutro Abs: 6.5 10*3/uL (ref 1.7–7.7)
Neutrophils Relative %: 62 %
PLATELETS: 322 10*3/uL (ref 150–400)
RBC: 4.25 MIL/uL (ref 3.87–5.11)
RDW: 12.4 % (ref 11.5–15.5)
WBC: 10.4 10*3/uL (ref 4.0–10.5)

## 2017-10-23 LAB — COMPREHENSIVE METABOLIC PANEL
ALK PHOS: 53 U/L (ref 38–126)
ALT: 25 U/L (ref 14–54)
ANION GAP: 8 (ref 5–15)
AST: 21 U/L (ref 15–41)
Albumin: 4.1 g/dL (ref 3.5–5.0)
BUN: 16 mg/dL (ref 6–20)
CALCIUM: 9.3 mg/dL (ref 8.9–10.3)
CO2: 23 mmol/L (ref 22–32)
CREATININE: 0.67 mg/dL (ref 0.44–1.00)
Chloride: 104 mmol/L (ref 101–111)
Glucose, Bld: 101 mg/dL — ABNORMAL HIGH (ref 65–99)
Potassium: 4 mmol/L (ref 3.5–5.1)
SODIUM: 135 mmol/L (ref 135–145)
Total Bilirubin: 0.5 mg/dL (ref 0.3–1.2)
Total Protein: 7.5 g/dL (ref 6.5–8.1)

## 2017-10-23 MED ORDER — PREDNISONE 10 MG PO TABS: 20.0000 mg | ORAL_TABLET | Freq: Every day | ORAL | 0 refills | Status: DC

## 2017-10-23 MED ORDER — PREDNISONE 50 MG PO TABS
60.0000 mg | ORAL_TABLET | Freq: Once | ORAL | Status: AC
  Administered 2017-10-23: 60 mg via ORAL
  Filled 2017-10-23: qty 1

## 2017-10-23 MED ORDER — IBUPROFEN 800 MG PO TABS: 800.0000 mg | ORAL_TABLET | Freq: Three times a day (TID) | ORAL | 0 refills | Status: DC | PRN

## 2017-10-23 MED ORDER — AZITHROMYCIN 250 MG PO TABS: ORAL_TABLET | ORAL | 0 refills | Status: DC

## 2017-10-23 MED ORDER — IPRATROPIUM-ALBUTEROL 0.5-2.5 (3) MG/3ML IN SOLN
3.0000 mL | Freq: Once | RESPIRATORY_TRACT | Status: AC
  Administered 2017-10-23: 3 mL via RESPIRATORY_TRACT
  Filled 2017-10-23: qty 3

## 2017-10-23 MED ORDER — ALBUTEROL SULFATE (2.5 MG/3ML) 0.083% IN NEBU
2.5000 mg | INHALATION_SOLUTION | Freq: Once | RESPIRATORY_TRACT | Status: AC
  Administered 2017-10-23: 2.5 mg via RESPIRATORY_TRACT
  Filled 2017-10-23: qty 3

## 2017-10-23 NOTE — ED Notes (Signed)
Right hand on knuckles are swollen and pt would like to get hand assessed

## 2017-10-23 NOTE — Discharge Instructions (Signed)
Follow up with a family md if not improving °

## 2017-10-23 NOTE — ED Notes (Signed)
Pt states she is not feeling any better. Still has expiratory and inspiratory wheezes bilaterally.

## 2017-10-23 NOTE — ED Notes (Signed)
Pt states she is feeling better. Breath sounds still, have expiratory wheezes, but she is not as labored with breathing and coughing isn't as significant

## 2017-10-23 NOTE — ED Provider Notes (Signed)
[facility] EMERGENCY DEPARTMENT Provider Note   CSN: [id number] Arrival date & time: 10/23/17  1418     History   Chief Complaint Chief Complaint  Patient presents with  . Cough  . Hand Pain    HPI Heather Mckinney is a 46 y.o. female.  Patient complains of shortness of breath cough wheezing she also has pain in her right hand   The history is provided by the patient. No language interpreter was used.  Cough  This is a new problem. The current episode started 2 days ago. The problem occurs constantly. The problem has not changed since onset.The cough is non-productive. There has been no fever. Pertinent negatives include no chest pain and no headaches. She has tried nothing for the symptoms. The treatment provided no relief. She is not a smoker.  Hand Pain  Pertinent negatives include no chest pain, no abdominal pain and no headaches.    Past Medical History:  Diagnosis Date  . COPD (chronic obstructive pulmonary disease) (HCC)     There are no active problems to display for this patient.   Past Surgical History:  Procedure Laterality Date  . CESAREAN SECTION    . CHOLECYSTECTOMY      OB History    No data available       Home Medications    Prior to Admission medications   Medication Sig Start Date End Date Taking? Authorizing Provider  albuterol (PROVENTIL HFA;VENTOLIN HFA) 108 (90 Base) MCG/ACT inhaler Inhale 2 puffs into the lungs every 4 (four) hours as needed for wheezing or shortness of breath. 09/12/17  Yes Cheron Schaumann K, PA-C  naproxen sodium (ANAPROX) 220 MG tablet Take 440 mg by mouth daily as needed (FOR PAIN).   Yes [provider]  azithromycin (ZITHROMAX Z-PAK) 250 MG tablet 2 po day one, then 1 daily x 4 days 10/23/17   Bethann Berkshire, MD  ibuprofen (ADVIL,MOTRIN) 800 MG tablet Take 1 tablet (800 mg total) by mouth every 8 (eight) hours as needed for moderate pain. 10/23/17   Bethann Berkshire, MD  predniSONE (DELTASONE) 10 MG tablet  Take 2 tablets (20 mg total) by mouth daily. 10/23/17   Bethann Berkshire, MD    Family History No family history on file.  Social History Social History  Substance Use Topics  . Smoking status: Never Smoker  . Smokeless tobacco: Never Used  . Alcohol use No     Allergies   Ephedrine and Aspirin   Review of Systems Review of Systems  Constitutional: Negative for appetite change and fatigue.  HENT: Negative for congestion, ear discharge and sinus pressure.   Eyes: Negative for discharge.  Respiratory: Positive for cough.   Cardiovascular: Negative for chest pain.  Gastrointestinal: Negative for abdominal pain and diarrhea.  Genitourinary: Negative for frequency and hematuria.  Musculoskeletal: Positive for arthralgias. Negative for back pain.  Skin: Negative for rash.  Neurological: Negative for seizures and headaches.  Psychiatric/Behavioral: Negative for hallucinations.     Physical Exam Updated Vital Signs BP 134/77 (BP Location: Right Arm)   Pulse 89   Temp 98.4 F (36.9 C) (Oral)   Resp 17   Ht 5\' 7"  (1.702 m)   Wt (!) 136.5 kg (301 lb)   LMP 10/07/2017   SpO2 96%   BMI 47.14 kg/m   Physical Exam  Constitutional: She is oriented to person, place, and time. She appears well-developed.  HENT:  Head: Normocephalic.  Eyes: Conjunctivae and EOM are normal. No scleral icterus.  Neck: Neck supple. No thyromegaly present.  Cardiovascular: Normal rate and regular rhythm.  Exam reveals no gallop and no friction rub.   No murmur heard. Pulmonary/Chest: No stridor. She has wheezes. She has no rales. She exhibits no tenderness.  Abdominal: She exhibits no distension. There is no tenderness. There is no rebound.  Musculoskeletal: Normal range of motion. She exhibits no edema.  Tender posterior hand over MCP and wrist  Lymphadenopathy:    She has no cervical adenopathy.  Neurological: She is oriented to person, place, and time. She exhibits normal muscle tone.  Coordination normal.  Skin: No rash noted. No erythema.  Psychiatric: She has a normal mood and affect. Her behavior is normal.     ED Treatments / Results  Labs (all labs ordered are listed, but only abnormal results are displayed) Labs Reviewed  CBC WITH DIFFERENTIAL/PLATELET - Abnormal; Notable for the following:       Result Value   Eosinophils Absolute 0.8 (*)    All other components within normal limits  COMPREHENSIVE METABOLIC PANEL - Abnormal; Notable for the following:    Glucose, Bld 101 (*)    All other components within normal limits    EKG  EKG Interpretation None       Radiology Dg Chest 2 View  Result Date: 10/23/2017 CLINICAL DATA:  Shortness breath and cough for 1 month EXAM: CHEST  2 VIEW COMPARISON:  September 12, 2017 FINDINGS: The heart size and mediastinal contours are within normal limits. There is no focal infiltrate, pulmonary edema, or pleural effusion. There is scoliosis of spine. The visualized skeletal structures are otherwise unremarkable. IMPRESSION: No active cardiopulmonary disease. Electronically Signed   By: Sherian ReinWei-Chen  Lin M.D.   On: 10/23/2017 15:09   Dg Hand Complete Right  Result Date: 10/23/2017 CLINICAL DATA:  Pain across the first through third metacarpal joints for several weeks, no history of trauma EXAM: RIGHT HAND - COMPLETE 3+ VIEW COMPARISON:  Right hand films of 09/14/2016 FINDINGS: The radiocarpal joint space appears normal and the ulnar styloid is intact. The carpal bones are normal position. MCP, PIP, and DIP joints appear normal. No erosion is seen. IMPRESSION: Negative. Electronically Signed   By: Dwyane DeePaul  Barry M.D.   On: 10/23/2017 15:25    Procedures Procedures (including critical care time)  Medications Ordered in ED Medications  predniSONE (DELTASONE) tablet 60 mg (60 mg Oral Given 10/23/17 1518)  ipratropium-albuterol (DUONEB) 0.5-2.5 (3) MG/3ML nebulizer solution 3 mL (3 mLs Nebulization Given 10/23/17 1535)    albuterol (PROVENTIL) (2.5 MG/3ML) 0.083% nebulizer solution 2.5 mg (2.5 mg Nebulization Given 10/23/17 1535)  ipratropium-albuterol (DUONEB) 0.5-2.5 (3) MG/3ML nebulizer solution 3 mL (3 mLs Nebulization Given 10/23/17 1710)  albuterol (PROVENTIL) (2.5 MG/3ML) 0.083% nebulizer solution 2.5 mg (2.5 mg Nebulization Given 10/23/17 1710)     Initial Impression / Assessment and Plan / ED Course  I have reviewed the triage vital signs and the nursing notes.  Pertinent labs & imaging results that were available during my care of the patient were reviewed by me and considered in my medical decision making (see chart for details).   Patient with bronchitis and bronchospasm along with an inflamed right hand.  She will be placed on prednisone Z-Pak and use her albuterol inhaler for her breathing.  She is also given Motrin for hand and will follow-up with the PCP    Final Clinical Impressions(s) / ED Diagnoses   Final diagnoses:  Cough    New Prescriptions New Prescriptions  AZITHROMYCIN (ZITHROMAX Z-PAK) 250 MG TABLET    2 po day one, then 1 daily x 4 days   IBUPROFEN (ADVIL,MOTRIN) 800 MG TABLET    Take 1 tablet (800 mg total) by mouth every 8 (eight) hours as needed for moderate pain.   PREDNISONE (DELTASONE) 10 MG TABLET    Take 2 tablets (20 mg total) by mouth daily.     Bethann Berkshire, MD 10/23/17 [phone]

## 2017-10-23 NOTE — ED Triage Notes (Signed)
Patient complains of congestion and cough. States was treated for bronchitis in September and cough subsided a little but flared back up past week. Denies fever chills.

## 2018-03-23 ENCOUNTER — Encounter (HOSPITAL_COMMUNITY): Payer: Self-pay | Admitting: Emergency Medicine

## 2018-03-23 ENCOUNTER — Emergency Department (HOSPITAL_COMMUNITY)
Admission: EM | Admit: 2018-03-23 | Discharge: 2018-03-23 | Disposition: A | Discharge status: Home / Self Care | Payer: Self-pay | Attending: Emergency Medicine | Admitting: Emergency Medicine

## 2018-03-23 ENCOUNTER — Other Ambulatory Visit: Payer: Self-pay

## 2018-03-23 DIAGNOSIS — M159 Polyosteoarthritis, unspecified: Secondary | ICD-10-CM

## 2018-03-23 DIAGNOSIS — R600 Localized edema: Secondary | ICD-10-CM | POA: Insufficient documentation

## 2018-03-23 DIAGNOSIS — T148XXA Other injury of unspecified body region, initial encounter: Secondary | ICD-10-CM

## 2018-03-23 DIAGNOSIS — M1991 Primary osteoarthritis, unspecified site: Secondary | ICD-10-CM | POA: Insufficient documentation

## 2018-03-23 DIAGNOSIS — J449 Chronic obstructive pulmonary disease, unspecified: Secondary | ICD-10-CM | POA: Insufficient documentation

## 2018-03-23 DIAGNOSIS — M150 Primary generalized (osteo)arthritis: Secondary | ICD-10-CM

## 2018-03-23 DIAGNOSIS — M62830 Muscle spasm of back: Secondary | ICD-10-CM | POA: Insufficient documentation

## 2018-03-23 HISTORY — DX: Unspecified osteoarthritis, unspecified site: M19.90

## 2018-03-23 MED ORDER — DEXAMETHASONE 4 MG PO TABS: 4.0000 mg | ORAL_TABLET | Freq: Two times a day (BID) | ORAL | 0 refills | Status: DC

## 2018-03-23 MED ORDER — CYCLOBENZAPRINE HCL 10 MG PO TABS: 10.0000 mg | ORAL_TABLET | Freq: Three times a day (TID) | ORAL | 0 refills | Status: DC

## 2018-03-23 MED ORDER — TRAMADOL HCL 50 MG PO TABS: 50.0000 mg | ORAL_TABLET | Freq: Four times a day (QID) | ORAL | 0 refills | Status: DC | PRN

## 2018-03-23 NOTE — Discharge Instructions (Addendum)
Blood pressure is elevated at 162/97.  Please see your local health department, or the [facility] to have this rechecked.  Your examination favors muscle strain, as well as possible arthritis attack.  Please use Decadron 2 times daily, Flexeril 3 times daily for spasm, use Ultram for more severe pain.  Ultram and Flexeril may cause drowsiness, please do not drive a vehicle, operate machinery, drink alcohol, or participate in activities requiring concentration when taking either of these medications.  [facility] to assist you in finding a primary physician.
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
## 2018-12-02 ENCOUNTER — Other Ambulatory Visit: Payer: Self-pay

## 2018-12-02 ENCOUNTER — Encounter (HOSPITAL_COMMUNITY): Payer: Self-pay | Admitting: Emergency Medicine

## 2018-12-02 ENCOUNTER — Emergency Department (HOSPITAL_COMMUNITY): Payer: Self-pay

## 2018-12-02 ENCOUNTER — Emergency Department (HOSPITAL_COMMUNITY)
Admission: EM | Admit: 2018-12-02 | Discharge: 2018-12-02 | Disposition: A | Discharge status: Home / Self Care | Payer: Self-pay | Attending: Emergency Medicine | Admitting: Emergency Medicine

## 2018-12-02 DIAGNOSIS — J449 Chronic obstructive pulmonary disease, unspecified: Secondary | ICD-10-CM | POA: Insufficient documentation

## 2018-12-02 DIAGNOSIS — Z79899 Other long term (current) drug therapy: Secondary | ICD-10-CM | POA: Insufficient documentation

## 2018-12-02 DIAGNOSIS — J40 Bronchitis, not specified as acute or chronic: Secondary | ICD-10-CM | POA: Insufficient documentation

## 2018-12-02 MED ORDER — BENZONATATE 200 MG PO CAPS: 200.0000 mg | ORAL_CAPSULE | Freq: Three times a day (TID) | ORAL | 0 refills | Status: DC | PRN

## 2018-12-02 MED ORDER — ALBUTEROL SULFATE (2.5 MG/3ML) 0.083% IN NEBU
2.5000 mg | INHALATION_SOLUTION | Freq: Once | RESPIRATORY_TRACT | Status: AC
  Administered 2018-12-02: 2.5 mg via RESPIRATORY_TRACT
  Filled 2018-12-02: qty 3

## 2018-12-02 MED ORDER — PREDNISONE 20 MG PO TABS
40.0000 mg | ORAL_TABLET | Freq: Once | ORAL | Status: AC
  Administered 2018-12-02: 40 mg via ORAL
  Filled 2018-12-02: qty 2

## 2018-12-02 MED ORDER — ALBUTEROL SULFATE HFA 108 (90 BASE) MCG/ACT IN AERS
2.0000 | INHALATION_SPRAY | Freq: Once | RESPIRATORY_TRACT | Status: AC
  Administered 2018-12-02: 2 via RESPIRATORY_TRACT
  Filled 2018-12-02: qty 6.7

## 2018-12-02 MED ORDER — PREDNISONE 20 MG PO TABS: 40.0000 mg | ORAL_TABLET | Freq: Every day | ORAL | 0 refills | Status: DC

## 2018-12-02 MED ORDER — DOXYCYCLINE HYCLATE 100 MG PO CAPS: 100.0000 mg | ORAL_CAPSULE | Freq: Two times a day (BID) | ORAL | 0 refills | Status: DC

## 2018-12-02 NOTE — Progress Notes (Signed)
**Note De-Identified Heather Mckinney Obfuscation** Patient educated on MDI and spacer with Dial Check flow system; flow was WNL

## 2018-12-02 NOTE — ED Notes (Signed)
Respiratory informed about nebulizer treatment. 

## 2018-12-02 NOTE — ED Triage Notes (Signed)
Patient reports productive cough with thick yellowish sputum, onset of symptoms 1 week ago, worse over the past 2 days. No fever.

## 2018-12-02 NOTE — ED Provider Notes (Signed)
[facility] EMERGENCY DEPARTMENT Provider Note   CSN: [id number] Arrival date & time: 12/02/18  1418     History   Chief Complaint Chief Complaint  Patient presents with  . Cough    HPI Heather Mckinney is a 47 y.o. female.  HPI   Heather Mckinney is a 47 y.o. female who presents to the Emergency Department complaining of worsening cough and chest tightness.  Symptoms have been present for 2 to 3 days.  Symptoms have also been associated with mild sore throat and nasal congestion.  Cough has been productive at times of colored sputum.  She reports history of COPD and using inhalers in the past, but does not currently have one.  She endorses wheezing at times and states her breathing feels labored upon exertion.  She denies any chest pain, fever, chills, hemoptysis, abdominal pain and vomiting.  She states that she gets bronchitis frequently this time of year.  Possible sick contacts reported    Past Medical History:  Diagnosis Date  . Arthritis   . COPD (chronic obstructive pulmonary disease) (HCC)     There are no active problems to display for this patient.   Past Surgical History:  Procedure Laterality Date  . CESAREAN SECTION    . CHOLECYSTECTOMY       OB History    Gravida  3   Para  3   Term  3   Preterm      AB      Living        SAB      TAB      Ectopic      Multiple      Live Births               Home Medications    Prior to Admission medications   Medication Sig Start Date End Date Taking? Authorizing Provider  albuterol (PROVENTIL HFA;VENTOLIN HFA) 108 (90 Base) MCG/ACT inhaler Inhale 2 puffs into the lungs every 4 (four) hours as needed for wheezing or shortness of breath. 09/12/17   Elson Areas, PA-C  azithromycin (ZITHROMAX Z-PAK) 250 MG tablet 2 po day one, then 1 daily x 4 days 10/23/17   Bethann Berkshire, MD  benzonatate (TESSALON) 200 MG capsule Take 1 capsule (200 mg total) by mouth 3 (three) times daily as needed for  cough. 12/02/18   Derrell Milanes, PA-C  cyclobenzaprine (FLEXERIL) 10 MG tablet Take 1 tablet (10 mg total) by mouth 3 (three) times daily. 03/23/18   Ivery Quale, PA-C  dexamethasone (DECADRON) 4 MG tablet Take 1 tablet (4 mg total) by mouth 2 (two) times daily with a meal. 03/23/18   Ivery Quale, PA-C  doxycycline (VIBRAMYCIN) 100 MG capsule Take 1 capsule (100 mg total) by mouth 2 (two) times daily. 12/02/18   Ellery Meroney, PA-C  ibuprofen (ADVIL,MOTRIN) 800 MG tablet Take 1 tablet (800 mg total) by mouth every 8 (eight) hours as needed for moderate pain. 10/23/17   Bethann Berkshire, MD  naproxen sodium (ANAPROX) 220 MG tablet Take 440 mg by mouth daily as needed (FOR PAIN).    [provider]  predniSONE (DELTASONE) 20 MG tablet Take 2 tablets (40 mg total) by mouth daily. 12/02/18   Kiarra Kidd, PA-C  traMADol (ULTRAM) 50 MG tablet Take 1 tablet (50 mg total) by mouth every 6 (six) hours as needed. 03/23/18   Ivery Quale, PA-C    Family History Family History  Problem Relation Age of Onset  .  Diabetes Mother   . Hypertension Mother   . Asthma Mother     Social History Social History   Tobacco Use  . Smoking status: Never Smoker  . Smokeless tobacco: Never Used  Substance Use Topics  . Alcohol use: No  . Drug use: No     Allergies   Ephedrine and Aspirin   Review of Systems Review of Systems  Constitutional: Negative for appetite change, chills and fever.  HENT: Positive for congestion and sore throat. Negative for trouble swallowing.   Respiratory: Positive for cough, chest tightness, shortness of breath and wheezing.   Cardiovascular: Negative for chest pain.  Gastrointestinal: Negative for abdominal pain, nausea and vomiting.  Genitourinary: Negative for dysuria.  Musculoskeletal: Negative for arthralgias.  Skin: Negative for rash.  Neurological: Negative for dizziness, weakness, numbness and headaches.  Hematological: Negative for adenopathy.      Physical Exam Updated Vital Signs BP 135/85 (BP Location: Left Arm)   Pulse 89   Temp 97.8 F (36.6 C) (Temporal)   Resp (!) 22   Ht 5\' 8"  (1.727 m)   Wt 124.3 kg   LMP 11/26/2018 (Exact Date)   SpO2 99%   BMI 41.66 kg/m   Physical Exam  Constitutional: She is oriented to person, place, and time. She appears well-developed and well-nourished. No distress.  HENT:  Head: Normocephalic and atraumatic.  Right Ear: Tympanic membrane and ear canal normal.  Left Ear: Tympanic membrane and ear canal normal.  Mouth/Throat: Uvula is midline, oropharynx is clear and moist and mucous membranes are normal. No oropharyngeal exudate.  Eyes: Pupils are equal, round, and reactive to light. EOM are normal.  Neck: Normal range of motion, full passive range of motion without pain and phonation normal. Neck supple.  Cardiovascular: Normal rate, regular rhythm and intact distal pulses.  No murmur heard. Pulmonary/Chest: Effort normal. No stridor. No respiratory distress. She has wheezes. She has no rales. She exhibits no tenderness.  Lung sounds are diminished bilaterally with expiratory wheezes present in all lung fields.  Abdominal: Soft. She exhibits no distension. There is no tenderness.  Musculoskeletal: She exhibits no edema.  Lymphadenopathy:    She has no cervical adenopathy.  Neurological: She is alert and oriented to person, place, and time. She exhibits normal muscle tone. Coordination normal.  Skin: Skin is warm. Capillary refill takes less than 2 seconds.  Psychiatric: She has a normal mood and affect.  Nursing note and vitals reviewed.    ED Treatments / Results  Labs (all labs ordered are listed, but only abnormal results are displayed) Labs Reviewed - No data to display  EKG None  Radiology Dg Chest 2 View  Result Date: 12/02/2018 CLINICAL DATA:  Semi productive cough.  Shortness of breath. EXAM: CHEST - 2 VIEW COMPARISON:  October 23, 2017 FINDINGS: The heart size  and mediastinal contours are within normal limits. Both lungs are clear. The visualized skeletal structures are unremarkable. IMPRESSION: No active cardiopulmonary disease. Electronically Signed   By: Gerome Samavid  Williams III M.D   On: 12/02/2018 15:06    Procedures Procedures (including critical care time)  Medications Ordered in ED Medications  predniSONE (DELTASONE) tablet 40 mg (40 mg Oral Given 12/02/18 1620)  albuterol (PROVENTIL) (2.5 MG/3ML) 0.083% nebulizer solution 2.5 mg (2.5 mg Nebulization Given 12/02/18 1638)  albuterol (PROVENTIL HFA;VENTOLIN HFA) 108 (90 Base) MCG/ACT inhaler 2 puff (2 puffs Inhalation Given 12/02/18 1620)  albuterol (PROVENTIL) (2.5 MG/3ML) 0.083% nebulizer solution 2.5 mg (2.5 mg Nebulization Given 12/02/18  1738)     Initial Impression / Assessment and Plan / ED Course  I have reviewed the triage vital signs and the nursing notes.  Pertinent labs & imaging results that were available during my care of the patient were reviewed by me and considered in my medical decision making (see chart for details).     Patient with cough wheezing and nasal congestion.  Chest x-ray negative for pneumonia.  She is well-appearing nontoxic.  Expiratory wheezes heard on initial exam.  Will give prednisone and order albuterol nebulizer treatment.  On recheck, patient reports feeling much better, lung sounds greatly improved.  No hypoxia or tachycardia.  Ambulated in the department without distress.  She appears appropriate for discharge home will dispense albuterol inhaler prescription for prednisone.  She agrees to treatment plan and close outpatient follow-up.  Return precautions were discussed.   Final Clinical Impressions(s) / ED Diagnoses   Final diagnoses:  Bronchitis    ED Discharge Orders         Ordered    predniSONE (DELTASONE) 20 MG tablet  Daily     12/02/18 1802    doxycycline (VIBRAMYCIN) 100 MG capsule  2 times daily     12/02/18 1802    benzonatate  (TESSALON) 200 MG capsule  3 times daily PRN     12/02/18 1802           Pauline Aus, PA-C 12/03/18 0007    Mesner, Barbara Cower, MD 12/03/18 2149

## 2018-12-02 NOTE — Discharge Instructions (Addendum)
2 puffs of the albuterol inhaler 4 times a day as needed.  Drink plenty of fluids.  Start the prednisone prescription tomorrow.  Follow-up with your primary provider for recheck or return to the ER for any worsening symptoms.

## 2019-02-23 ENCOUNTER — Other Ambulatory Visit: Payer: Self-pay

## 2019-02-23 ENCOUNTER — Encounter (HOSPITAL_COMMUNITY): Payer: Self-pay | Admitting: Emergency Medicine

## 2019-02-23 ENCOUNTER — Emergency Department (HOSPITAL_COMMUNITY): Payer: Self-pay

## 2019-02-23 ENCOUNTER — Emergency Department (HOSPITAL_COMMUNITY)
Admission: EM | Admit: 2019-02-23 | Discharge: 2019-02-23 | Disposition: A | Discharge status: Home / Self Care | Payer: Self-pay | Attending: Emergency Medicine | Admitting: Emergency Medicine

## 2019-02-23 DIAGNOSIS — J209 Acute bronchitis, unspecified: Secondary | ICD-10-CM | POA: Insufficient documentation

## 2019-02-23 DIAGNOSIS — Z7722 Contact with and (suspected) exposure to environmental tobacco smoke (acute) (chronic): Secondary | ICD-10-CM | POA: Insufficient documentation

## 2019-02-23 DIAGNOSIS — Z79899 Other long term (current) drug therapy: Secondary | ICD-10-CM | POA: Insufficient documentation

## 2019-02-23 DIAGNOSIS — J449 Chronic obstructive pulmonary disease, unspecified: Secondary | ICD-10-CM | POA: Insufficient documentation

## 2019-02-23 MED ORDER — ALBUTEROL SULFATE HFA 108 (90 BASE) MCG/ACT IN AERS: 1.0000 | INHALATION_SPRAY | Freq: Four times a day (QID) | RESPIRATORY_TRACT | 0 refills | Status: DC | PRN

## 2019-02-23 MED ORDER — BENZONATATE 100 MG PO CAPS: 100.0000 mg | ORAL_CAPSULE | Freq: Three times a day (TID) | ORAL | 0 refills | Status: DC

## 2019-02-23 MED ORDER — ALBUTEROL (5 MG/ML) CONTINUOUS INHALATION SOLN
10.0000 mg/h | INHALATION_SOLUTION | Freq: Once | RESPIRATORY_TRACT | Status: AC
  Administered 2019-02-23: 10 mg/h via RESPIRATORY_TRACT
  Filled 2019-02-23: qty 20

## 2019-02-23 MED ORDER — IPRATROPIUM-ALBUTEROL 0.5-2.5 (3) MG/3ML IN SOLN
3.0000 mL | Freq: Once | RESPIRATORY_TRACT | Status: AC
  Administered 2019-02-23: 3 mL via RESPIRATORY_TRACT
  Filled 2019-02-23: qty 3

## 2019-02-23 MED ORDER — PREDNISONE 50 MG PO TABS
60.0000 mg | ORAL_TABLET | Freq: Once | ORAL | Status: AC
  Administered 2019-02-23: 17:00:00 60 mg via ORAL
  Filled 2019-02-23: qty 1

## 2019-02-23 MED ORDER — PREDNISONE 10 MG PO TABS: ORAL_TABLET | ORAL | 0 refills | Status: DC

## 2019-02-23 MED ORDER — DOXYCYCLINE HYCLATE 100 MG PO CAPS: 100.0000 mg | ORAL_CAPSULE | Freq: Two times a day (BID) | ORAL | 0 refills | Status: DC

## 2019-02-23 MED ORDER — DOXYCYCLINE HYCLATE 100 MG PO TABS
100.0000 mg | ORAL_TABLET | Freq: Once | ORAL | Status: AC
  Administered 2019-02-23: 100 mg via ORAL
  Filled 2019-02-23: qty 1

## 2019-02-23 NOTE — ED Provider Notes (Signed)
[facility] EMERGENCY DEPARTMENT Provider Note   CSN: [id number] Arrival date & time: 02/23/19  1419    History   Chief Complaint Chief Complaint  Patient presents with  . Cough    HPI Heather Mckinney is a 48 y.o. female.     The history is provided by the patient. No language interpreter was used.  Cough  Cough characteristics:  Productive Sputum characteristics:  Bloody Severity:  Moderate Onset quality:  Gradual Duration:  6 days Timing:  Constant Progression:  Worsening Chronicity:  New Smoker: no   Relieved by:  Nothing Worsened by:  Nothing Ineffective treatments:  Beta-agonist inhaler Associated symptoms: wheezing   Pt reports she has a cough and congestion Pt reports wheezing with no relief from albuterol.  Pt as a history of same.   Past Medical History:  Diagnosis Date  . Arthritis   . COPD (chronic obstructive pulmonary disease) (HCC)     There are no active problems to display for this patient.   Past Surgical History:  Procedure Laterality Date  . CESAREAN SECTION    . CHOLECYSTECTOMY       OB History    Gravida  3   Para  3   Term  3   Preterm      AB      Living        SAB      TAB      Ectopic      Multiple      Live Births               Home Medications    Prior to Admission medications   Medication Sig Start Date End Date Taking? Authorizing Provider  albuterol (PROVENTIL HFA;VENTOLIN HFA) 108 (90 Base) MCG/ACT inhaler Inhale 2 puffs into the lungs every 4 (four) hours as needed for wheezing or shortness of breath. 09/12/17   Elson Areas, PA-C  azithromycin (ZITHROMAX Z-PAK) 250 MG tablet 2 po day one, then 1 daily x 4 days 10/23/17   Bethann Berkshire, MD  benzonatate (TESSALON) 200 MG capsule Take 1 capsule (200 mg total) by mouth 3 (three) times daily as needed for cough. 12/02/18   Triplett, Tammy, PA-C  cyclobenzaprine (FLEXERIL) 10 MG tablet Take 1 tablet (10 mg total) by mouth 3 (three) times daily.  03/23/18   Ivery Quale, PA-C  dexamethasone (DECADRON) 4 MG tablet Take 1 tablet (4 mg total) by mouth 2 (two) times daily with a meal. 03/23/18   Ivery Quale, PA-C  doxycycline (VIBRAMYCIN) 100 MG capsule Take 1 capsule (100 mg total) by mouth 2 (two) times daily. 12/02/18   Triplett, Tammy, PA-C  ibuprofen (ADVIL,MOTRIN) 800 MG tablet Take 1 tablet (800 mg total) by mouth every 8 (eight) hours as needed for moderate pain. 10/23/17   Bethann Berkshire, MD  naproxen sodium (ANAPROX) 220 MG tablet Take 440 mg by mouth daily as needed (FOR PAIN).    [provider]  predniSONE (DELTASONE) 20 MG tablet Take 2 tablets (40 mg total) by mouth daily. 12/02/18   Triplett, Tammy, PA-C  traMADol (ULTRAM) 50 MG tablet Take 1 tablet (50 mg total) by mouth every 6 (six) hours as needed. 03/23/18   Ivery Quale, PA-C    Family History Family History  Problem Relation Age of Onset  . Diabetes Mother   . Hypertension Mother   . Asthma Mother     Social History Social History   Tobacco Use  . Smoking status: Passive  Smoke Exposure - Never Smoker  . Smokeless tobacco: Never Used  Substance Use Topics  . Alcohol use: No  . Drug use: No     Allergies   Ephedrine and Aspirin   Review of Systems Review of Systems  Respiratory: Positive for cough and wheezing.   All other systems reviewed and are negative.    Physical Exam Updated Vital Signs BP 127/75   Pulse 86   Temp 98.5 F (36.9 C) (Oral)   Resp (!) 24   Ht 5\' 7"  (1.702 m)   Wt 123.8 kg   LMP 02/05/2019 (Approximate)   SpO2 100%   BMI 42.76 kg/m   Physical Exam Vitals signs and nursing note reviewed.  Constitutional:      Appearance: She is well-developed.  HENT:     Head: Normocephalic.     Nose: Nose normal.     Mouth/Throat:     Mouth: Mucous membranes are moist.  Eyes:     Pupils: Pupils are equal, round, and reactive to light.  Neck:     Musculoskeletal: Normal range of motion.  Cardiovascular:     Rate  and Rhythm: Normal rate.  Pulmonary:     Breath sounds: Wheezing and rhonchi present.  Abdominal:     General: There is no distension.  Musculoskeletal: Normal range of motion.  Neurological:     Mental Status: She is alert and oriented to person, place, and time.  Psychiatric:        Mood and Affect: Mood normal.      ED Treatments / Results  Labs (all labs ordered are listed, but only abnormal results are displayed) Labs Reviewed - No data to display  EKG None  Radiology Dg Chest 2 View  Result Date: 02/23/2019 CLINICAL DATA:  Cough and shortness of breath EXAM: CHEST - 2 VIEW COMPARISON:  December 02, 2018. FINDINGS: There is no appreciable edema or consolidation. Heart size and pulmonary vascularity are normal. No adenopathy. No bone lesions. IMPRESSION: No edema or consolidation. Electronically Signed   By: Bretta Bang III M.D.   On: 02/23/2019 16:30    Procedures Procedures (including critical care time)  Medications Ordered in ED Medications  ipratropium-albuterol (DUONEB) 0.5-2.5 (3) MG/3ML nebulizer solution 3 mL (3 mLs Nebulization Given 02/23/19 1659)  predniSONE (DELTASONE) tablet 60 mg (60 mg Oral Given 02/23/19 1651)  doxycycline (VIBRA-TABS) tablet 100 mg (100 mg Oral Given 02/23/19 1651)  albuterol (PROVENTIL,VENTOLIN) solution continuous neb (10 mg/hr Nebulization Given 02/23/19 1805)     Initial Impression / Assessment and Plan / ED Course  I have reviewed the triage vital signs and the nursing notes.  Pertinent labs & imaging results that were available during my care of the patient were reviewed by me and considered in my medical decision making (see chart for details).        Pt given duoneb, prednisone and doxycycline.   On reexam pt has continued wheezing.   Pt placed on a monitor and given a one hour continous neb treatment.  Pt sounds better,  Loud rhonchi but no wheezing.  02 sats 100%  Final Clinical Impressions(s) / ED Diagnoses    Final diagnoses:  Acute bronchitis, unspecified organism    ED Discharge Orders         Ordered    albuterol (PROVENTIL HFA;VENTOLIN HFA) 108 (90 Base) MCG/ACT inhaler  Every 6 hours PRN     02/23/19 2017    predniSONE (DELTASONE) 10 MG tablet  02/23/19 2017    benzonatate (TESSALON) 100 MG capsule  Every 8 hours     02/23/19 2017    doxycycline (VIBRAMYCIN) 100 MG capsule  2 times daily     02/23/19 2017        An After Visit Summary was printed and given to the patient.    Elson Areas, Cordelia Poche 02/23/19 2018    Maia Plan, MD 02/24/19 [id number]

## 2019-02-23 NOTE — ED Triage Notes (Signed)
Pt c/o productive cough since Thursday. Pt states she has seen "small amounts of blood in phlegm." Pt reports hx of chronic bronchitis. Denies fever.

## 2019-04-01 ENCOUNTER — Emergency Department (HOSPITAL_COMMUNITY)
Admission: EM | Admit: 2019-04-01 | Discharge: 2019-04-01 | Disposition: A | Discharge status: Home / Self Care | Payer: Self-pay | Attending: Emergency Medicine | Admitting: Emergency Medicine

## 2019-04-01 ENCOUNTER — Encounter (HOSPITAL_COMMUNITY): Payer: Self-pay | Admitting: Emergency Medicine

## 2019-04-01 ENCOUNTER — Other Ambulatory Visit: Payer: Self-pay

## 2019-04-01 DIAGNOSIS — Y9384 Activity, sleeping: Secondary | ICD-10-CM | POA: Insufficient documentation

## 2019-04-01 DIAGNOSIS — Y33XXXA Other specified events, undetermined intent, initial encounter: Secondary | ICD-10-CM | POA: Insufficient documentation

## 2019-04-01 DIAGNOSIS — Z7722 Contact with and (suspected) exposure to environmental tobacco smoke (acute) (chronic): Secondary | ICD-10-CM | POA: Insufficient documentation

## 2019-04-01 DIAGNOSIS — T161XXA Foreign body in right ear, initial encounter: Secondary | ICD-10-CM | POA: Insufficient documentation

## 2019-04-01 DIAGNOSIS — J449 Chronic obstructive pulmonary disease, unspecified: Secondary | ICD-10-CM | POA: Insufficient documentation

## 2019-04-01 DIAGNOSIS — Y999 Unspecified external cause status: Secondary | ICD-10-CM | POA: Insufficient documentation

## 2019-04-01 DIAGNOSIS — Y92003 Bedroom of unspecified non-institutional (private) residence as the place of occurrence of the external cause: Secondary | ICD-10-CM | POA: Insufficient documentation

## 2019-04-01 MED ORDER — LIDOCAINE HCL (PF) 2 % IJ SOLN
5.0000 mL | Freq: Once | INTRAMUSCULAR | Status: AC
  Administered 2019-04-01: 22:00:00 5 mL

## 2019-04-01 MED ORDER — LIDOCAINE HCL (PF) 2 % IJ SOLN
INTRAMUSCULAR | Status: AC
  Administered 2019-04-01: 5 mL
  Filled 2019-04-01: qty 10

## 2019-04-01 MED ORDER — HYDROGEN PEROXIDE 3 % EX SOLN
CUTANEOUS | Status: AC
  Administered 2019-04-01: 23:00:00 via TOPICAL
  Filled 2019-04-01: qty 473

## 2019-04-01 MED ORDER — NEOMYCIN-POLYMYXIN-HC 1 % OT SOLN
3.0000 [drp] | Freq: Once | OTIC | Status: AC
  Administered 2019-04-01: 3 [drp] via OTIC
  Filled 2019-04-01: qty 10

## 2019-04-01 MED ORDER — HYDROGEN PEROXIDE 3 % EX SOLN
Freq: Once | CUTANEOUS | Status: AC
  Administered 2019-04-01: 23:00:00 via TOPICAL

## 2019-04-01 NOTE — ED Notes (Signed)
The remaining cortisporin Otic solution given to pt for home use with instructions on how to use

## 2019-04-01 NOTE — ED Triage Notes (Addendum)
Pt states she "felt something crawl in my right ear." Pt states she can "feel it moving."

## 2019-04-01 NOTE — ED Notes (Signed)
Assisted [place], [place] with ear irrigation and attempt to remove bug from pt ear canal- unsuccessful.

## 2019-04-01 NOTE — Discharge Instructions (Addendum)
As discussed, call Dr Jenne Pane early in the morning to arrange an office visit for removal of this insect.  You may apply the antibiotic drop to your ear - 3 drops every 8 hours to protect the ear canal from infection and to help with discomfort (there is a steroid in this medicine which will help with inflammation as well.

## 2019-04-02 DIAGNOSIS — T161XXA Foreign body in right ear, initial encounter: Secondary | ICD-10-CM | POA: Diagnosis not present

## 2019-04-02 NOTE — ED Provider Notes (Signed)
[facility] EMERGENCY DEPARTMENT Provider Note   CSN: [id number] Arrival date & time: 04/01/19  2125    History   Chief Complaint Chief Complaint  Patient presents with  . Foreign Body in Ear    HPI Heather Mckinney is a 48 y.o. female.     The history is provided by the patient.  Foreign Body in Ear  This is a new problem. The current episode started less than 1 hour ago. The problem occurs constantly. The problem has not changed since onset.Pertinent negatives include no shortness of breath. Nothing aggravates the symptoms. Nothing relieves the symptoms. Treatments tried: attempts to flush unsuccessful.  also applied rubbing alcohol without relief.  she continues to feel occasional movement in the ear.   Pt was sleeping tonight when she woke to an insect crawling in her right ear.  Suspects cockroach. Past Medical History:  Diagnosis Date  . Arthritis   . COPD (chronic obstructive pulmonary disease) (HCC)     There are no active problems to display for this patient.   Past Surgical History:  Procedure Laterality Date  . CESAREAN SECTION    . CHOLECYSTECTOMY       OB History    Gravida  3   Para  3   Term  3   Preterm      AB      Living        SAB      TAB      Ectopic      Multiple      Live Births               Home Medications    Prior to Admission medications   Medication Sig Start Date End Date Taking? Authorizing Provider  albuterol (PROVENTIL HFA;VENTOLIN HFA) 108 (90 Base) MCG/ACT inhaler Inhale 1-2 puffs into the lungs every 6 (six) hours as needed for wheezing or shortness of breath. 02/23/19   Elson Areas, PA-C  benzonatate (TESSALON) 100 MG capsule Take 1 capsule (100 mg total) by mouth every 8 (eight) hours. 02/23/19   Elson Areas, PA-C  doxycycline (VIBRAMYCIN) 100 MG capsule Take 1 capsule (100 mg total) by mouth 2 (two) times daily. 02/23/19   Elson Areas, PA-C  predniSONE (DELTASONE) 10 MG tablet 6,5,4,3,2,1 taper  02/23/19   Elson Areas, PA-C    Family History Family History  Problem Relation Age of Onset  . Diabetes Mother   . Hypertension Mother   . Asthma Mother     Social History Social History   Tobacco Use  . Smoking status: Passive Smoke Exposure - Never Smoker  . Smokeless tobacco: Never Used  Substance Use Topics  . Alcohol use: No  . Drug use: No     Allergies   Ephedrine and Aspirin   Review of Systems Review of Systems  Constitutional: Negative for chills and fever.  HENT: Positive for ear pain and hearing loss. Negative for congestion, rhinorrhea, sinus pressure, sore throat, trouble swallowing and voice change.   Respiratory: Negative for cough, shortness of breath, wheezing and stridor.   All other systems reviewed and are negative.    Physical Exam Updated Vital Signs BP (!) 149/91   Pulse 87   Temp 98.2 F (36.8 C) (Oral)   Resp 20   Ht 5\' 7"  (1.702 m)   Wt 135.6 kg   LMP 03/11/2019   SpO2 99%   BMI 46.83 kg/m   Physical Exam Constitutional:  Appearance: She is well-developed.  HENT:     Head: Normocephalic and atraumatic.     Right Ear: A foreign body is present.     Ears:     Comments: Cockroach appears wedged deeply within the right ear canal.  Unable to completely visualize TM.     Mouth/Throat:     Pharynx: Uvula midline.     Tonsils: No tonsillar abscesses.  Eyes:     Conjunctiva/sclera: Conjunctivae normal.  Cardiovascular:     Rate and Rhythm: Normal rate.  Pulmonary:     Effort: Pulmonary effort is normal.  Skin:    General: Skin is warm and dry.  Neurological:     Mental Status: She is alert and oriented to person, place, and time.      ED Treatments / Results  Labs (all labs ordered are listed, but only abnormal results are displayed) Labs Reviewed - No data to display  EKG None  Radiology No results found.  Procedures .Foreign Body Removal Date/Time: 04/01/2019 11:00 PM Performed by: Burgess Amor, PA-C  Authorized by: Burgess Amor, PA-C  Consent: Verbal consent obtained. Risks and benefits: risks, benefits and alternatives were discussed Consent given by: patient Patient understanding: patient states understanding of the procedure being performed Time out: Immediately prior to procedure a "time out" was called to verify the correct patient, procedure, equipment, support staff and site/side marked as required. Body area: ear Anesthesia method: topical lidocaine for anesthesia and attempt to drown the insect. Localization method: ENT speculum Removal mechanism: alligator forceps, irrigation, suction and ear scoop Complexity: complex 0 objects recovered. Objects recovered: unable to remove  Post-procedure assessment: foreign body not removed Comments: Insect wedged very deep within the ear canal.  I was unable to safely remove with the tools available.  There was a small abrasion at the inferior ear canal with small amount of bleeding after which further attempts were aborted.  There was no further movement from the insect which I suspect was drowned.   (including critical care time)  Medications Ordered in ED Medications  lidocaine (XYLOCAINE) 2 % injection 5 mL (5 mLs Other Given 04/01/19 2222)  hydrogen peroxide 3 % external solution ( Topical Given by Other 04/01/19 2315)  NEOMYCIN-POLYMYXIN-HYDROCORTISONE (CORTISPORIN) OTIC (EAR) solution 3 drop (3 drops Right EAR Given 04/01/19 2352)     Initial Impression / Assessment and Plan / ED Course  I have reviewed the triage vital signs and the nursing notes.  Pertinent labs & imaging results that were available during my care of the patient were reviewed by me and considered in my medical decision making (see chart for details).        Pt with right ear cockroach foreign body, wedged deep in the ear canal.  She was referred to ENT for definitive removal of this object.  She was given a dose of cortisporin here and remainder for home to  protect from infection given the canal abrasion.  She understands to call Dr. Jenne Pane in the morning for appt.   Final Clinical Impressions(s) / ED Diagnoses   Final diagnoses:  Ear foreign body, right, initial encounter    ED Discharge Orders    None       Victoriano Lain 04/02/19 0024    Bethann Berkshire, MD 04/13/19 0830

## 2019-05-06 DIAGNOSIS — J449 Chronic obstructive pulmonary disease, unspecified: Secondary | ICD-10-CM | POA: Diagnosis not present

## 2019-05-06 DIAGNOSIS — Z008 Encounter for other general examination: Secondary | ICD-10-CM | POA: Diagnosis not present

## 2019-05-06 DIAGNOSIS — E6609 Other obesity due to excess calories: Secondary | ICD-10-CM | POA: Diagnosis not present

## 2019-05-06 DIAGNOSIS — G43001 Migraine without aura, not intractable, with status migrainosus: Secondary | ICD-10-CM | POA: Diagnosis not present

## 2019-05-13 DIAGNOSIS — Z0001 Encounter for general adult medical examination with abnormal findings: Secondary | ICD-10-CM | POA: Diagnosis not present

## 2019-05-13 DIAGNOSIS — J449 Chronic obstructive pulmonary disease, unspecified: Secondary | ICD-10-CM | POA: Diagnosis not present

## 2019-05-13 DIAGNOSIS — Z6841 Body Mass Index (BMI) 40.0 and over, adult: Secondary | ICD-10-CM | POA: Diagnosis not present

## 2019-05-13 DIAGNOSIS — Z124 Encounter for screening for malignant neoplasm of cervix: Secondary | ICD-10-CM | POA: Diagnosis not present

## 2019-05-13 DIAGNOSIS — G43001 Migraine without aura, not intractable, with status migrainosus: Secondary | ICD-10-CM | POA: Diagnosis not present

## 2019-05-13 DIAGNOSIS — E6609 Other obesity due to excess calories: Secondary | ICD-10-CM | POA: Diagnosis not present

## 2019-05-13 DIAGNOSIS — E782 Mixed hyperlipidemia: Secondary | ICD-10-CM | POA: Diagnosis not present

## 2019-09-28 ENCOUNTER — Other Ambulatory Visit: Payer: Self-pay

## 2019-09-28 DIAGNOSIS — Z20822 Contact with and (suspected) exposure to covid-19: Secondary | ICD-10-CM

## 2019-09-29 LAB — NOVEL CORONAVIRUS, NAA: SARS-CoV-2, NAA: NOT DETECTED

## 2019-10-29 DIAGNOSIS — G43001 Migraine without aura, not intractable, with status migrainosus: Secondary | ICD-10-CM | POA: Diagnosis not present

## 2019-10-29 DIAGNOSIS — H814 Vertigo of central origin: Secondary | ICD-10-CM | POA: Diagnosis not present

## 2019-11-28 ENCOUNTER — Telehealth: Payer: Self-pay

## 2019-11-30 DIAGNOSIS — R05 Cough: Secondary | ICD-10-CM | POA: Diagnosis not present

## 2019-11-30 DIAGNOSIS — R0602 Shortness of breath: Secondary | ICD-10-CM | POA: Diagnosis not present

## 2020-01-03 ENCOUNTER — Ambulatory Visit: Payer: BC Managed Care – PPO | Admitting: Neurology

## 2020-01-03 ENCOUNTER — Encounter: Payer: Self-pay | Admitting: Neurology

## 2020-01-03 ENCOUNTER — Other Ambulatory Visit: Payer: Self-pay

## 2020-01-03 ENCOUNTER — Telehealth: Payer: Self-pay | Admitting: *Deleted

## 2020-01-03 ENCOUNTER — Other Ambulatory Visit: Payer: Self-pay | Admitting: Neurology

## 2020-01-03 VITALS — BP 139/96 | HR 78 | Temp 97.8°F | Ht 67.0 in | Wt 283.0 lb

## 2020-01-03 DIAGNOSIS — R519 Headache, unspecified: Secondary | ICD-10-CM | POA: Diagnosis not present

## 2020-01-03 DIAGNOSIS — H539 Unspecified visual disturbance: Secondary | ICD-10-CM

## 2020-01-03 DIAGNOSIS — G43101 Migraine with aura, not intractable, with status migrainosus: Secondary | ICD-10-CM | POA: Diagnosis not present

## 2020-01-03 DIAGNOSIS — G43711 Chronic migraine without aura, intractable, with status migrainosus: Secondary | ICD-10-CM | POA: Insufficient documentation

## 2020-01-03 DIAGNOSIS — R413 Other amnesia: Secondary | ICD-10-CM

## 2020-01-03 DIAGNOSIS — G4484 Primary exertional headache: Secondary | ICD-10-CM

## 2020-01-03 DIAGNOSIS — H93A9 Pulsatile tinnitus, unspecified ear: Secondary | ICD-10-CM

## 2020-01-03 DIAGNOSIS — R51 Headache with orthostatic component, not elsewhere classified: Secondary | ICD-10-CM

## 2020-01-03 MED ORDER — AJOVY 225 MG/1.5ML ~~LOC~~ SOAJ: 225.0000 mg | SUBCUTANEOUS | 11 refills | Status: DC

## 2020-01-03 MED ORDER — NORTRIPTYLINE HCL 10 MG PO CAPS: 10.0000 mg | ORAL_CAPSULE | Freq: Every day | ORAL | 3 refills | Status: DC

## 2020-01-03 MED ORDER — UBRELVY 100 MG PO TABS: 100.0000 mg | ORAL_TABLET | ORAL | 11 refills | Status: DC | PRN

## 2020-01-03 NOTE — Telephone Encounter (Signed)
Ajovy PA completed on CMM. Key: [id number]. Awaiting determination from [facility].

## 2020-01-03 NOTE — Progress Notes (Signed)
[facility]    Provider:  Dr Jaynee Eagles Requesting Provider: Pablo Lawrence, NP Primary Care Provider:  Celene Squibb, MD  CC:  Migraines  HPI:  Heather Mckinney is a 49 y.o. female here as requested by Pablo Lawrence, NP for migraines and dizzy spells. PMHx migraines, depression, COPD, arthritis, anxiety.  I reviewed Pablo Lawrence' notes: Patient's had migraines and dizziness for years, worsening over the last year, migraines with nausea, over-the-counter medications do not help, recent falls due to vertigo, also blurry vision with dizzy spells, numbness of the right hand when gripping the steering well, she has a family history of migraines and her mother died at age 87 of a brain aneurysm. She was prescribed ubrelvy and meclizine. She has lost 70 pounds on Keto Diet. She was adopted but met her birth mom at the age of 55, she had migraines as does her daughter. She has had migraines as long as she can remember. Worsening in the last year. New symptoms as well. She did not have insurance until recently. She does not remember her childhood and this concerns her. She has migraines with dizziness and the dizziness makes her fall. The dizziness is vertigo. She has daily headaches and daily migraines, continuous. The last 6 months she has had slurred speech, stuttering, morning and positional headaches, acute episodes of alteration of awareness she is walking and doesn't know where she is, she does not sleep well, right wakes up with headache ors and dry mouth, snores, you are very tired during the day tired during the day, memory changes.  If she yells or exerts herself her head hurts, valsalva makes it hurt. She has asked sensory changes in the right hand and arm.  Headaches are at the back of her skul andl/neck. Migraines are unilateral, behind the eyes, pounding, throbbing, pulsating, she can hear pulsing in the ear, light and sound sensitivity, movement makes it worse, nausea and  vomiting they have lasted up to 8-9 days. Migraines are moderately severe to severe, at least 10 severe migraine days and daily migraines. Alleve and tylenol do not help. A dark room helps. She tried imitrex (nasal did not tolerate). Also vision changes, worsening vision. No other focal neurologic deficits, associated symptoms, inciting events or modifiable factors.  Reviewed notes, labs and imaging from outside physicians, which showed:  MR cervical spine 2008 reviewed report: IMPRESSION:  1. Reversal of the normal cervical lordosis as discussed above.   2. Diffuse bulging discs at C4-5 and C5-6 with minimal flattening of the anterior thecal sac.   3. Shallow broad based disc protrusion at C6-7 with biforaminal encroachment left greater than right and mild impression on the anterior thecal sac diffusely.   4. No acute bony findings.  CMP and cbc unremarkable in 09/2017 reviewed labs  Review of Systems: Patient complains of symptoms per HPI as well as the following symptoms: Blurred vision, double vision, fatigue, eye pain, easy bruising, shortness of breath, feeling hot, memory loss, confusion, headaches, numbness, weakness, slurred speech, dizziness, insomnia, depression, anxiety and racing thoughts. Pertinent negatives and positives per HPI. All others negative.   Social History   Socioeconomic History  . Marital status: Married    Spouse name: Not on file  . Number of children: 3  . Years of education: Not on file  . Highest education level: Some college, no degree  Occupational History  . Not on file  Tobacco Use  . Smoking status: Passive Smoke Exposure - Never Smoker  .  Smokeless tobacco: Never Used  Substance and Sexual Activity  . Alcohol use: Yes    Comment: glass of wine once in awhile  . Drug use: Never  . Sexual activity: Not on file  Other Topics Concern  . Not on file  Social History Narrative   Lives at home with husband Remo Lipps   Right handed   Caffeine: 1-2  twenty oz Diet Dr [employer] day   Social Determinants of Health   Financial Resource Strain:   . Difficulty of Paying Living Expenses: Not on file  Food Insecurity:   . Worried About Charity fundraiser in the Last Year: Not on file  . Ran Out of Food in the Last Year: Not on file  Transportation Needs:   . Lack of Transportation (Medical): Not on file  . Lack of Transportation (Non-Medical): Not on file  Physical Activity:   . Days of Exercise per Week: Not on file  . Minutes of Exercise per Session: Not on file  Stress:   . Feeling of Stress : Not on file  Social Connections:   . Frequency of Communication with Friends and Family: Not on file  . Frequency of Social Gatherings with Friends and Family: Not on file  . Attends Religious Services: Not on file  . Active Member of Clubs or Organizations: Not on file  . Attends Archivist Meetings: Not on file  . Marital Status: Not on file  Intimate Partner Violence:   . Fear of Current or Ex-Partner: Not on file  . Emotionally Abused: Not on file  . Physically Abused: Not on file  . Sexually Abused: Not on file    Family History  Problem Relation Age of Onset  . Diabetes Mother   . Hypertension Mother   . Asthma Mother   . Cerebral aneurysm Mother   . Heart Problems Mother   . Migraines Mother   . Migraines Daughter   . Vision loss Daughter   . Hydrocephalus Son     Past Medical History:  Diagnosis Date  . Anxiety   . Arthritis   . COPD (chronic obstructive pulmonary disease) ([place])   . Depression   . Migraine     Patient Active Problem List   Diagnosis Date Noted  . Chronic migraine without aura, with intractable migraine, so stated, with status migrainosus 01/03/2020  . Migraine with aura and with status migrainosus, not intractable 01/03/2020    Past Surgical History:  Procedure Laterality Date  . CESAREAN SECTION    . CHOLECYSTECTOMY      Current Outpatient Medications  Medication Sig Dispense  Refill  . albuterol (PROVENTIL HFA;VENTOLIN HFA) 108 (90 Base) MCG/ACT inhaler Inhale 1-2 puffs into the lungs every 6 (six) hours as needed for wheezing or shortness of breath. 1 Inhaler 0  . meclizine (ANTIVERT) 25 MG tablet Take 12.5 mg by mouth 3 (three) times daily as needed for dizziness.     . Fremanezumab-vfrm (AJOVY) 225 MG/1.5ML SOAJ Inject 225 mg into the skin every 30 (thirty) days. 1 pen 11  . nortriptyline (PAMELOR) 10 MG capsule Take 1 capsule (10 mg total) by mouth at bedtime. 60 capsule 3   No current facility-administered medications for this visit.    Allergies as of 01/03/2020 - Review Complete 01/03/2020  Allergen Reaction Noted  . Ephedrine Other (See Comments) 05/04/2015  . Aspirin Other (See Comments) 05/04/2015    Vitals: BP (!) 139/96 (BP Location: Left Arm, Patient Position: Sitting, Cuff Size: Large)  Pulse 78   Temp 97.8 F (36.6 C) Comment: taken at front  Ht 5' 7"  (1.702 m)   Wt 283 lb (128.4 kg)   BMI 44.32 kg/m  Last Weight:  Wt Readings from Last 1 Encounters:  01/03/20 283 lb (128.4 kg)   Last Height:   Ht Readings from Last 1 Encounters:  01/03/20 5' 7"  (1.702 m)     Physical exam: Exam: Gen: NAD, conversant, well nourised, obese, well groomed                     CV: RRR, no MRG. No Carotid Bruits. No peripheral edema, warm, nontender Eyes: Conjunctivae clear without exudates or hemorrhage  Neuro: Detailed Neurologic Exam  Speech:    Speech is normal; fluent and spontaneous with normal comprehension.  Cognition:    The patient is oriented to person, place, and time;     recent and remote memory intact;     language fluent;     normal attention, concentration,     fund of knowledge Cranial Nerves:    The pupils are equal, round, and reactive to light. Attempted fundoscopy could not visualize. Visual fields are full to finger confrontation. Extraocular movements are intact. Trigeminal sensation is intact and the muscles of  mastication are normal. The face is symmetric. The palate elevates in the midline. Hearing intact. Voice is normal. Shoulder shrug is normal. The tongue has normal motion without fasciculations.   Coordination:    Normal finger to nose and heel to shin. Normal rapid alternating movements.   Gait:    Normal native gait  Motor Observation:    No asymmetry, no atrophy, and no involuntary movements noted. Tone:    Normal muscle tone.    Posture:    Posture is normal. normal erect    Strength:    Strength is V/V in the upper and lower limbs.      Sensation: intact to LT     Reflex Exam:  DTR's:    Deep tendon reflexes in the upper and lower extremities are symmetrical bilaterally.   Toes:    The toes are downgoing bilaterally.   Clonus:    Clonus is absent.    Assessment/Plan:  Lovely patient with chronic migraines with and without aura. However given multiple concerning symptoms she needs a thorough evaluation  Obesity, morning and positional headaches, acute episodes of alteration of awareness she is walking and doesn't know where she is, she does not sleep well, right wakes up with headache ors and dry mouth, snores, you are very tired during the day tired during the day, memory changes. Needs sleep evaluation.  MRI brain and MRA of the head due to concerning symptoms of morning headaches, positional headaches,vision changes, vertigo and also numbness and tingling in the right now I was and  Exertional headaches to look for space occupying mass, chiari or intracranial hypertension (pseudotumor), aneurysm. FHx of cerebral aneurysm.   Start Nortriptyline at bedtime for migraine prevention, may also help her sleep. Low dose.  Start Ajovy, she is suffering so much we will start this despite that she has not cycled through all the "regular" migraine meds, it works great and fast.    Orders Placed This Encounter  Procedures  . MR BRAIN W WO CONTRAST  . MR ANGIO HEAD WO CONTRAST    . CBC  . CMP  . TSH  . Ambulatory referral to Sleep Studies   Meds ordered this encounter  Medications  .  Fremanezumab-vfrm (AJOVY) 225 MG/1.5ML SOAJ    Sig: Inject 225 mg into the skin every 30 (thirty) days.    Dispense:  1 pen    Refill:  11    Patient has copay card; she can have medication for $5 regardless of insurance approval or copay amount.  . nortriptyline (PAMELOR) 10 MG capsule    Sig: Take 1 capsule (10 mg total) by mouth at bedtime.    Dispense:  60 capsule    Refill:  3    Discussed: To prevent or relieve headaches, try the following: Cool Compress. Lie down and place a cool compress on your head.  Avoid headache triggers. If certain foods or odors seem to have triggered your migraines in the past, avoid them. A headache diary might help you identify triggers.  Include physical activity in your daily routine. Try a daily walk or other moderate aerobic exercise.  Manage stress. Find healthy ways to cope with the stressors, such as delegating tasks on your to-do list.  Practice relaxation techniques. Try deep breathing, yoga, massage and visualization.  Eat regularly. Eating regularly scheduled meals and maintaining a healthy diet might help prevent headaches. Also, drink plenty of fluids.  Follow a regular sleep schedule. Sleep deprivation might contribute to headaches Consider biofeedback. With this mind-body technique, you learn to control certain bodily functions -- such as muscle tension, heart rate and blood pressure -- to prevent headaches or reduce headache pain.    Proceed to emergency room if you experience new or worsening symptoms or symptoms do not resolve, if you have new neurologic symptoms or if headache is severe, or for any concerning symptom.   Provided education and documentation from American headache Society toolbox including articles on: chronic migraine medication overuse headache, chronic migraines, prevention of migraines, behavioral and other  nonpharmacologic treatments for headache.   "There is increased risk for stroke in women with migraine with aura and a contraindication for the combined contraceptive pill for use by women who have migraine with aura. The risk for women with migraine without aura is lower. However other risk factors like smoking are far more likely to increase stroke risk than migraine. There is a recommendation for no smoking and for the use of OCPs without estrogen such as progestogen only pills particularly for women with migraine with aura.Marland Kitchen People who have migraine headaches with auras may be 3 times more likely to have a stroke caused by a blood clot, compared to migraine patients who don't see auras. Women who take hormone-replacement therapy may be 30 percent more likely to suffer a clot-based stroke than women not taking medication containing estrogen. Other risk factors like smoking and high blood pressure may be  much more important."   Cc: Pablo Lawrence, NP,  Celene Squibb, MD  Sarina Ill, MD  [facility] [address] [place], [place] [address]  Phone [phone] Fax [phone]

## 2020-01-03 NOTE — Progress Notes (Signed)
ubrelvy

## 2020-01-03 NOTE — Patient Instructions (Addendum)
MRI of the brain - will call Sleep eval - will call Start Nortriptyline at Bedtime Start Ajovy monthly injectipon   Fremanezumab injection What is this medicine? FREMANEZUMAB (fre ma NEZ ue mab) is used to prevent migraine headaches. This medicine may be used for other purposes; ask your health care provider or pharmacist if you have questions. COMMON BRAND NAME(S): AJOVY What should I tell my health care provider before I take this medicine? They need to know if you have any of these conditions:  an unusual or allergic reaction to fremanezumab, other medicines, foods, dyes, or preservatives  pregnant or trying to get pregnant  breast-feeding How should I use this medicine? This medicine is for injection under the skin. You will be taught how to prepare and give this medicine. Use exactly as directed. Take your medicine at regular intervals. Do not take your medicine more often than directed. It is important that you put your used needles and syringes in a special sharps container. Do not put them in a trash can. If you do not have a sharps container, call your pharmacist or healthcare provider to get one. Talk to your pediatrician regarding the use of this medicine in children. Special care may be needed. Overdosage: If you think you have taken too much of this medicine contact a poison control center or emergency room at once. NOTE: This medicine is only for you. Do not share this medicine with others. What if I miss a dose? If you miss a dose, take it as soon as you can. If it is almost time for your next dose, take only that dose. Do not take double or extra doses. What may interact with this medicine? Interactions are not expected. This list may not describe all possible interactions. Give your health care provider a list of all the medicines, herbs, non-prescription drugs, or dietary supplements you use. Also tell them if you smoke, drink alcohol, or use illegal drugs. Some items  may interact with your medicine. What should I watch for while using this medicine? Tell your doctor or healthcare professional if your symptoms do not start to get better or if they get worse. What side effects may I notice from receiving this medicine? Side effects that you should report to your doctor or health care professional as soon as possible:  allergic reactions like skin rash, itching or hives, swelling of the face, lips, or tongue Side effects that usually do not require medical attention (report these to your doctor or health care professional if they continue or are bothersome):  pain, redness, or irritation at site where injected This list may not describe all possible side effects. Call your doctor for medical advice about side effects. You may report side effects to FDA at 1-800-FDA-1088. Where should I keep my medicine? Keep out of the reach of children. You will be instructed on how to store this medicine. Throw away any unused medicine after the expiration date on the label. NOTE: This sheet is a summary. It may not cover all possible information. If you have questions about this medicine, talk to your doctor, pharmacist, or health care provider.  2020 Elsevier/Gold Standard (2017-09-15 17:22:56)   Nortriptyline capsules What is this medicine? NORTRIPTYLINE (nor TRIP ti leen) is used to treat depression. This medicine may be used for other purposes; ask your health care provider or pharmacist if you have questions. COMMON BRAND NAME(S): Aventyl, Pamelor What should I tell my health care provider before I take this medicine?  They need to know if you have any of these conditions:  bipolar disorder  Brugada syndrome  difficulty passing urine  glaucoma  heart disease  if you drink alcohol  liver disease  schizophrenia  seizures  suicidal thoughts, plans or attempt; a previous suicide attempt by you or a family member  thyroid disease  an unusual or  allergic reaction to nortriptyline, other tricyclic antidepressants, other medicines, foods, dyes, or preservatives  pregnant or trying to get pregnant  breast-feeding How should I use this medicine? Take this medicine by mouth with a glass of water. Follow the directions on the prescription label. Take your doses at regular intervals. Do not take it more often than directed. Do not stop taking this medicine suddenly except upon the advice of your doctor. Stopping this medicine too quickly may cause serious side effects or your condition may worsen. A special MedGuide will be given to you by the pharmacist with each prescription and refill. Be sure to read this information carefully each time. Talk to your pediatrician regarding the use of this medicine in children. Special care may be needed. Overdosage: If you think you have taken too much of this medicine contact a poison control center or emergency room at once. NOTE: This medicine is only for you. Do not share this medicine with others. What if I miss a dose? If you miss a dose, take it as soon as you can. If it is almost time for your next dose, take only that dose. Do not take double or extra doses. What may interact with this medicine? Do not take this medicine with any of the following medications:  cisapride  dronedarone  linezolid  MAOIs like Carbex, Eldepryl, Marplan, Nardil, and Parnate  methylene blue (injected into a vein)  pimozide  thioridazine This medicine may also interact with the following medications:  alcohol  antihistamines for allergy, cough, and cold  atropine  certain medicines for bladder problems like oxybutynin, tolterodine  certain medicines for depression like amitriptyline, fluoxetine, sertraline  certain medicines for Parkinson's disease like benztropine, trihexyphenidyl  certain medicines for stomach problems like dicyclomine, hyoscyamine  certain medicines for travel sickness like  scopolamine  chlorpropamide  cimetidine  ipratropium  other medicines that prolong the QT interval (an abnormal heart rhythm) like dofetilide  other medicines that can cause serotonin syndrome like St. John's Wort, fentanyl, lithium, tramadol, tryptophan, buspirone, and some medicines for headaches like sumatriptan or rizatriptan  quinidine  reserpine  thyroid medicine This list may not describe all possible interactions. Give your health care provider a list of all the medicines, herbs, non-prescription drugs, or dietary supplements you use. Also tell them if you smoke, drink alcohol, or use illegal drugs. Some items may interact with your medicine. What should I watch for while using this medicine? Tell your doctor if your symptoms do not get better or if they get worse. Visit your doctor or health care professional for regular checks on your progress. Because it may take several weeks to see the full effects of this medicine, it is important to continue your treatment as prescribed by your doctor. Patients and their families should watch out for new or worsening thoughts of suicide or depression. Also watch out for sudden changes in feelings such as feeling anxious, agitated, panicky, irritable, hostile, aggressive, impulsive, severely restless, overly excited and hyperactive, or not being able to sleep. If this happens, especially at the beginning of treatment or after a change in dose, call your health  care professional. Dennis Bast may get drowsy or dizzy. Do not drive, use machinery, or do anything that needs mental alertness until you know how this medicine affects you. Do not stand or sit up quickly, especially if you are an older patient. This reduces the risk of dizzy or fainting spells. Alcohol may interfere with the effect of this medicine. Avoid alcoholic drinks. Do not treat yourself for coughs, colds, or allergies without asking your doctor or health care professional for advice. Some  ingredients can increase possible side effects. Your mouth may get dry. Chewing sugarless gum or sucking hard candy, and drinking plenty of water may help. Contact your doctor if the problem does not go away or is severe. This medicine may cause dry eyes and blurred vision. If you wear contact lenses you may feel some discomfort. Lubricating drops may help. See your eye doctor if the problem does not go away or is severe. This medicine can cause constipation. Try to have a bowel movement at least every 2 to 3 days. If you do not have a bowel movement for 3 days, call your doctor or health care professional. This medicine can make you more sensitive to the sun. Keep out of the sun. If you cannot avoid being in the sun, wear protective clothing and use sunscreen. Do not use sun lamps or tanning beds/booths. What side effects may I notice from receiving this medicine? Side effects that you should report to your doctor or health care professional as soon as possible:  allergic reactions like skin rash, itching or hives, swelling of the face, lips, or tongue  anxious  breathing problems  changes in vision  confusion  elevated mood, decreased need for sleep, racing thoughts, impulsive behavior  eye pain  fast, irregular heartbeat  feeling faint or lightheaded, falls  feeling agitated, angry, or irritable  fever with increased sweating  hallucination, loss of contact with reality  seizures  stiff muscles  suicidal thoughts or other mood changes  tingling, pain, or numbness in the feet or hands  trouble passing urine or change in the amount of urine  trouble sleeping  unusually weak or tired  vomiting  yellowing of the eyes or skin Side effects that usually do not require medical attention (report to your doctor or health care professional if they continue or are bothersome):  change in sex drive or performance  change in appetite or  weight  constipation  dizziness  dry mouth  nausea  tired  tremors  upset stomach This list may not describe all possible side effects. Call your doctor for medical advice about side effects. You may report side effects to FDA at 1-800-FDA-1088. Where should I keep my medicine? Keep out of the reach of children. Store at room temperature between 15 and 30 degrees C (59 and 86 degrees F). Keep container tightly closed. Throw away any unused medicine after the expiration date. NOTE: This sheet is a summary. It may not cover all possible information. If you have questions about this medicine, talk to your doctor, pharmacist, or health care provider.  2020 Elsevier/Gold Standard (2018-12-08 13:24:58)

## 2020-01-04 ENCOUNTER — Telehealth: Payer: Self-pay | Admitting: *Deleted

## 2020-01-04 LAB — CBC
Hematocrit: 41.4 % (ref 34.0–46.6)
Hemoglobin: 13.2 g/dL (ref 11.1–15.9)
MCH: 27.9 pg (ref 26.6–33.0)
MCHC: 31.9 g/dL (ref 31.5–35.7)
MCV: 88 fL (ref 79–97)
Platelets: 450 10*3/uL (ref 150–450)
RBC: 4.73 x10E6/uL (ref 3.77–5.28)
RDW: 12.4 % (ref 11.7–15.4)
WBC: 7 10*3/uL (ref 3.4–10.8)

## 2020-01-04 LAB — COMPREHENSIVE METABOLIC PANEL
ALT: 12 IU/L (ref 0–32)
AST: 14 IU/L (ref 0–40)
Albumin/Globulin Ratio: 1.6 (ref 1.2–2.2)
Albumin: 4.4 g/dL (ref 3.8–4.8)
Alkaline Phosphatase: 79 IU/L (ref 39–117)
BUN/Creatinine Ratio: 18 (ref 9–23)
BUN: 12 mg/dL (ref 6–24)
Bilirubin Total: 0.3 mg/dL (ref 0.0–1.2)
CO2: 22 mmol/L (ref 20–29)
Calcium: 9.7 mg/dL (ref 8.7–10.2)
Chloride: 104 mmol/L (ref 96–106)
Creatinine, Ser: 0.66 mg/dL (ref 0.57–1.00)
GFR calc Af Amer: 120 mL/min/{1.73_m2} (ref >59)
GFR calc non Af Amer: 104 mL/min/{1.73_m2} (ref >59)
Globulin, Total: 2.8 g/dL (ref 1.5–4.5)
Glucose: 90 mg/dL (ref 65–99)
Potassium: 5.1 mmol/L (ref 3.5–5.2)
Sodium: 139 mmol/L (ref 134–144)
Total Protein: 7.2 g/dL (ref 6.0–8.5)

## 2020-01-04 LAB — TSH: TSH: 2.57 u[IU]/mL (ref 0.450–4.500)

## 2020-01-04 NOTE — Telephone Encounter (Signed)
Heather Mckinney completed on CMM. Key: [id number]. Awaiting [employer] determination.

## 2020-01-05 NOTE — Telephone Encounter (Signed)
Per clinical review team, Ajovy denied d/t requirements of tried/failed medications. Must be from two different classes such as beta blockers (propranolol, atenolol), calcium channel blockers (diltiazem, verapamil), Botox, anti-epileptic drugs (topiramate, carbarmazepine), SSRIs (fluoxetine, paroxetine), and TCAs (amitriptyline, nortriptyline). If we shoud choose to appeal, fax to [phone]. Patient should be able to use the savings card (and is aware) despite denial for $5 copay per month.

## 2020-01-05 NOTE — Telephone Encounter (Addendum)
Heather Mckinney denied by the clinical services team. The requirements are that patient fails at least two preferred migraine medications (Rizatriptan, Sumatriptan, Zolmitriptan). Also they have said the medication cannot be approved with concurrent use of a preventive CGRP inhibitor. If we should choose to appeal fax to [phone].   Patient should be able to use the savings card. I messaged the pt in mychart.

## 2020-01-05 NOTE — Telephone Encounter (Signed)
I called [facility] and spoke with Crystal to discuss the savings card. The Ajovy did go through for $5. They may have to order it. She also was able to get the Bernita Raisin also to go through for $10. I updated the patient.

## 2020-01-17 ENCOUNTER — Ambulatory Visit: Payer: BC Managed Care – PPO | Admitting: Neurology

## 2020-01-17 ENCOUNTER — Encounter: Payer: Self-pay | Admitting: Neurology

## 2020-01-17 ENCOUNTER — Other Ambulatory Visit: Payer: Self-pay

## 2020-01-17 VITALS — BP 130/88 | HR 66 | Temp 96.6°F | Ht 67.0 in | Wt 285.0 lb

## 2020-01-17 DIAGNOSIS — R519 Headache, unspecified: Secondary | ICD-10-CM

## 2020-01-17 DIAGNOSIS — J439 Emphysema, unspecified: Secondary | ICD-10-CM

## 2020-01-17 DIAGNOSIS — Z6841 Body Mass Index (BMI) 40.0 and over, adult: Secondary | ICD-10-CM

## 2020-01-17 DIAGNOSIS — E662 Morbid (severe) obesity with alveolar hypoventilation: Secondary | ICD-10-CM

## 2020-01-17 DIAGNOSIS — G43711 Chronic migraine without aura, intractable, with status migrainosus: Secondary | ICD-10-CM

## 2020-01-17 DIAGNOSIS — G44011 Episodic cluster headache, intractable: Secondary | ICD-10-CM

## 2020-01-17 NOTE — Patient Instructions (Signed)
Cluster Headache Cluster headaches are deeply painful. They normally occur on one side of your head, but they may switch sides. Often, cluster headaches:  Are severe.  Happen often for a few weeks or months and then go away for a while.  Last from 15 minutes to 3 hours.  Happen at the same time each day.  Happen at night.  Happen many times a day. Follow these instructions at home:        Follow instructions from your doctor to care for yourself at home:  Go to bed at the same time each night. Get the same amount of sleep every night.  Avoid alcohol.  Stop smoking if you smoke. This includes cigarettes and e-cigarettes.  Take over-the-counter and prescription medicines only as told by your doctor.  Do not drive or use heavy machinery while taking prescription pain medicine.  Use oxygen as told by your doctor.  Exercise regularly.  Eat a healthy diet.  Write down when each headache happened, what kind of pain you had, how bad your pain was, and what you tried to help your pain. This is called a headache diary. Use it as told by your doctor. Contact a doctor if:  Your headaches get worse or they happen more often.  Your medicines are not helping. Get help right away if:  You pass out (faint).  You get weak or lose feeling (have numbness) on one side of your body or face.  You see two of everything (double vision).  You feel sick to your stomach (nauseous) or you throw up (vomit), and you do not stop after many hours.  You have trouble with your balance or with walking.  You have trouble talking.  You have neck pain or stiffness.  You have a fever. This information is not intended to replace advice given to you by your health care provider. Make sure you discuss any questions you have with your health care provider. Document Revised: 10/13/2019 Document Reviewed: 08/23/2016 Elsevier Patient Education  2020 Elsevier Inc.  
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
## 2020-02-02 ENCOUNTER — Ambulatory Visit: Payer: BC Managed Care – PPO

## 2020-02-02 ENCOUNTER — Other Ambulatory Visit: Payer: Self-pay

## 2020-02-02 DIAGNOSIS — R519 Headache, unspecified: Secondary | ICD-10-CM | POA: Diagnosis not present

## 2020-02-02 DIAGNOSIS — G43101 Migraine with aura, not intractable, with status migrainosus: Secondary | ICD-10-CM

## 2020-02-02 DIAGNOSIS — G4484 Primary exertional headache: Secondary | ICD-10-CM

## 2020-02-02 DIAGNOSIS — G43711 Chronic migraine without aura, intractable, with status migrainosus: Secondary | ICD-10-CM

## 2020-02-02 DIAGNOSIS — R51 Headache with orthostatic component, not elsewhere classified: Secondary | ICD-10-CM

## 2020-02-02 DIAGNOSIS — H539 Unspecified visual disturbance: Secondary | ICD-10-CM

## 2020-02-02 DIAGNOSIS — H93A9 Pulsatile tinnitus, unspecified ear: Secondary | ICD-10-CM

## 2020-02-02 MED ORDER — GADOBENATE DIMEGLUMINE 529 MG/ML IV SOLN
20.0000 mL | Freq: Once | INTRAVENOUS | Status: AC | PRN
  Administered 2020-02-02: 20 mL via INTRAVENOUS

## 2020-02-03 NOTE — Progress Notes (Signed)
MRIs normal, great news, have a great weekend

## 2020-02-03 NOTE — Progress Notes (Signed)
MRIs normal, great news, have a great weekend

## 2020-02-05 ENCOUNTER — Other Ambulatory Visit (HOSPITAL_COMMUNITY)
Admission: RE | Admit: 2020-02-05 | Discharge: 2020-02-05 | Disposition: A | Discharge status: Home / Self Care | Payer: BC Managed Care – PPO | Source: Ambulatory Visit | Attending: Neurology | Admitting: Neurology

## 2020-02-05 DIAGNOSIS — Z20822 Contact with and (suspected) exposure to covid-19: Secondary | ICD-10-CM | POA: Insufficient documentation

## 2020-02-05 DIAGNOSIS — Z01812 Encounter for preprocedural laboratory examination: Secondary | ICD-10-CM | POA: Diagnosis not present

## 2020-02-05 LAB — SARS CORONAVIRUS 2 (TAT 6-24 HRS): SARS Coronavirus 2: NEGATIVE

## 2020-02-07 NOTE — Progress Notes (Signed)
Negative corona swab

## 2020-02-08 ENCOUNTER — Ambulatory Visit (INDEPENDENT_AMBULATORY_CARE_PROVIDER_SITE_OTHER): Payer: BC Managed Care – PPO | Admitting: Neurology

## 2020-02-08 DIAGNOSIS — E66813 Obesity, class 3: Secondary | ICD-10-CM

## 2020-02-08 DIAGNOSIS — G44011 Episodic cluster headache, intractable: Secondary | ICD-10-CM

## 2020-02-08 DIAGNOSIS — R0683 Snoring: Secondary | ICD-10-CM

## 2020-02-08 DIAGNOSIS — R519 Headache, unspecified: Secondary | ICD-10-CM

## 2020-02-08 DIAGNOSIS — E662 Morbid (severe) obesity with alveolar hypoventilation: Secondary | ICD-10-CM

## 2020-02-08 DIAGNOSIS — G43711 Chronic migraine without aura, intractable, with status migrainosus: Secondary | ICD-10-CM

## 2020-02-08 DIAGNOSIS — J439 Emphysema, unspecified: Secondary | ICD-10-CM

## 2020-02-23 ENCOUNTER — Encounter: Payer: Self-pay | Admitting: Neurology

## 2020-02-23 DIAGNOSIS — R0683 Snoring: Secondary | ICD-10-CM | POA: Insufficient documentation

## 2020-02-23 NOTE — Progress Notes (Signed)
Loud Snoring was noted. EEG with early SWS (n3) sleep onset.   EKG was irregular - see attached screen shot   Post-study, the patient indicated that sleep was the same as usual.   IMPRESSION:  1. Clinically insignificant degree of Obstructive Sleep Apnea (OSA). 2. No Periodic Limb Movement Disorder (PLMD). 3. Loud Primary Snoring. 4. Non-specific abnormal EKG- isolated extrasystoles.  RECOMMENDATIONS:  1. I cannot see a correlation of headaches with the findings of this sleep study. The patient has no significant hypoxia, overall sleep efficiency was 81%. Please have follow up with Dr Lucia Gaskins.   No intervention needed.
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
## 2020-02-25 ENCOUNTER — Emergency Department (HOSPITAL_COMMUNITY)
Admission: EM | Admit: 2020-02-25 | Discharge: 2020-02-25 | Disposition: A | Discharge status: Home / Self Care | Payer: BC Managed Care – PPO | Attending: Emergency Medicine | Admitting: Emergency Medicine

## 2020-02-25 ENCOUNTER — Encounter (HOSPITAL_COMMUNITY): Payer: Self-pay | Admitting: Emergency Medicine

## 2020-02-25 ENCOUNTER — Other Ambulatory Visit: Payer: Self-pay

## 2020-02-25 DIAGNOSIS — M65321 Trigger finger, right index finger: Secondary | ICD-10-CM | POA: Diagnosis not present

## 2020-02-25 DIAGNOSIS — Z7722 Contact with and (suspected) exposure to environmental tobacco smoke (acute) (chronic): Secondary | ICD-10-CM | POA: Insufficient documentation

## 2020-02-25 DIAGNOSIS — M79644 Pain in right finger(s): Secondary | ICD-10-CM | POA: Diagnosis not present

## 2020-02-25 DIAGNOSIS — Z79899 Other long term (current) drug therapy: Secondary | ICD-10-CM | POA: Diagnosis not present

## 2020-02-25 DIAGNOSIS — J449 Chronic obstructive pulmonary disease, unspecified: Secondary | ICD-10-CM | POA: Insufficient documentation

## 2020-02-25 MED ORDER — PREDNISONE 50 MG PO TABS: 50.0000 mg | ORAL_TABLET | Freq: Every day | ORAL | 0 refills | Status: AC

## 2020-02-25 NOTE — ED Triage Notes (Signed)
Pt reports right hand pain discomfort and "knot" formation at the base of right index finger. Pt denies any known injury.

## 2020-02-25 NOTE — ED Provider Notes (Signed)
[facility] EMERGENCY DEPARTMENT Provider Note   CSN: [id number] Arrival date & time: 02/25/20  1009     History Chief Complaint  Patient presents with  . Hand Pain    Heather Mckinney is a 49 y.o. female presented for evaluation of right hand pain.  Patient states the past 4 weeks, she has had gradually worsening pain in her right palm.  Pain begins at the base of her right index finger and radiates to her palm.  It is worse with movement such as flexion.  She has been taking Advil without improvement of symptoms.  She denies trauma or injury.  She denies numbness or tingling.  She denies symptoms on the left hand or with any other finger.  She denies swelling, warmth, or erythema.  She states she can feel a ball at the base of her finger.  She has a history of asthma and migraines for which she takes medication, no other medical problems.  No history of diabetes.  She has had prednisone before and tolerated it well.  HPI     Past Medical History:  Diagnosis Date  . Anxiety   . Arthritis   . COPD (chronic obstructive pulmonary disease) (HCC)   . Depression   . Migraine     Patient Active Problem List   Diagnosis Date Noted  . Snoring 02/23/2020  . Intractable episodic cluster headache 01/17/2020  . Sleep related headaches 01/17/2020  . Class 3 severe obesity due to excess calories with serious comorbidity and body mass index (BMI) of 40.0 to 44.9 in adult (HCC) 01/17/2020  . Obesity with alveolar hypoventilation and body mass index (BMI) of 40 or greater (HCC) 01/17/2020  . Pulmonary emphysema (HCC) 01/17/2020  . Chronic migraine without aura, with intractable migraine, so stated, with status migrainosus 01/03/2020  . Migraine with aura and with status migrainosus, not intractable 01/03/2020    Past Surgical History:  Procedure Laterality Date  . CESAREAN SECTION    . CHOLECYSTECTOMY       OB History    Gravida  3   Para  3   Term  3   Preterm      AB      Living        SAB      TAB      Ectopic      Multiple      Live Births              Family History  Problem Relation Age of Onset  . Diabetes Mother   . Hypertension Mother   . Asthma Mother   . Cerebral aneurysm Mother   . Heart Problems Mother   . Migraines Mother   . Migraines Daughter   . Vision loss Daughter   . Hydrocephalus Son     Social History   Tobacco Use  . Smoking status: Passive Smoke Exposure - Never Smoker  . Smokeless tobacco: Never Used  Substance Use Topics  . Alcohol use: Yes    Comment: glass of wine once in awhile  . Drug use: Never    Home Medications Prior to Admission medications   Medication Sig Start Date End Date Taking? Authorizing Provider  albuterol (PROVENTIL HFA;VENTOLIN HFA) 108 (90 Base) MCG/ACT inhaler Inhale 1-2 puffs into the lungs every 6 (six) hours as needed for wheezing or shortness of breath. 02/23/19   Elson Areas, PA-C  Fremanezumab-vfrm (AJOVY) 225 MG/1.5ML SOAJ Inject 225 mg into the skin every 30 (  thirty) days. 01/03/20   Melvenia Beam, MD  meclizine (ANTIVERT) 25 MG tablet Take 12.5 mg by mouth 3 (three) times daily as needed for dizziness.     [provider]  nortriptyline (PAMELOR) 10 MG capsule Take 1 capsule (10 mg total) by mouth at bedtime. 01/03/20   Melvenia Beam, MD  predniSONE (DELTASONE) 50 MG tablet Take 1 tablet (50 mg total) by mouth daily for 7 days. 02/25/20 03/03/20  Abbegale Stehle, PA-C  Ubrogepant (UBRELVY) 100 MG TABS Take 100 mg by mouth every 2 (two) hours as needed. Maximum 200mg  a day. 01/03/20   Melvenia Beam, MD    Allergies    Ephedrine and Aspirin  Review of Systems   Review of Systems  Musculoskeletal: Positive for arthralgias.       R finger/hand pain  Neurological: Negative for numbness.    Physical Exam Updated Vital Signs BP (!) 174/65 (BP Location: Right Arm)   Pulse 70   Temp 98.1 F (36.7 C) (Oral)   Resp 18   Ht 5\' 8"  (1.727 m)   Wt 126.6 kg    LMP 12/25/2019   SpO2 98%   BMI 42.42 kg/m   Physical Exam Vitals and nursing note reviewed.  Constitutional:      General: She is not in acute distress.    Appearance: She is well-developed.  HENT:     Head: Normocephalic and atraumatic.  Pulmonary:     Effort: Pulmonary effort is normal.  Abdominal:     General: There is no distension.  Musculoskeletal:        General: Normal range of motion.       Hands:     Cervical back: Normal range of motion.     Comments: Small, nodular lesion on the palmar hand over the MCP. No erythema or warmth, mild swelling. Pt able to flex and extend at MCP, PIP, and DIP without weakness (but with pain).  Distal sensation and cap refill.  No tenderness palpation elsewhere in the hand.  Skin:    General: Skin is warm.     Findings: No rash.  Neurological:     Mental Status: She is alert and oriented to person, place, and time.     ED Results / Procedures / Treatments   Labs (all labs ordered are listed, but only abnormal results are displayed) Labs Reviewed - No data to display  EKG None  Radiology No results found.  Procedures Procedures (including critical care time)  Medications Ordered in ED Medications - No data to display  ED Course  I have reviewed the triage vital signs and the nursing notes.  Pertinent labs & imaging results that were available during my care of the patient were reviewed by me and considered in my medical decision making (see chart for details).    MDM Rules/Calculators/A&P                      Pt presenting for evaluation of right hand/finger pain.  Physical examination, she appears nontoxic.  She has a nodular area on the palmar aspect of the right MCP.  Concern for trigger finger.  No erythema, warmth, or signs of infection.  Discussed symptomatic treatment anti-inflammatories, ice, and immobilization.  Discussed follow-up with orthopedics if not improving.  At this time, patient appears safe for  discharge.  Return precautions given.  Patient states she understands and agrees to plan.  Final Clinical Impression(s) / ED Diagnoses Final diagnoses:  Trigger index finger of right hand    Rx / DC Orders ED Discharge Orders         Ordered    predniSONE (DELTASONE) 50 MG tablet  Daily     02/25/20 [address] Shermar Friedland, PA-C 02/25/20 1105    Mancel Bale, MD 02/28/20 1458

## 2020-02-25 NOTE — Discharge Instructions (Addendum)
Take prednisone as prescribed.  Do not take other anti-inflammatory such as ibuprofen or Aleve while taking this medicine.  You may supplement with Tylenol as needed for for further pain control. Use ice to help with pain and swelling. You may have pain relief with activity modification or splinting. You may continue with normal activity but avoid potentially aggravating movements such as pinching or grasping of the fingers. The splint can be worn based on trigger pattern and patient preferences (eg, daytime use, nighttime use, or use with activity). The suggested duration of splinting is generally three to six weeks. For milder cases or comfort, you can also tape the affected finger with the adjacent normal fingers to limit movement of the finger. Follow-up with the orthopedic doctor listed below if your symptoms are improving with this treatment. Return to the emergency room with any new, worsening, or concerning symptoms.

## 2020-03-02 ENCOUNTER — Encounter: Payer: Self-pay | Admitting: Orthopaedic Surgery

## 2020-03-07 ENCOUNTER — Ambulatory Visit (INDEPENDENT_AMBULATORY_CARE_PROVIDER_SITE_OTHER): Payer: BC Managed Care – PPO | Admitting: Orthopaedic Surgery

## 2020-03-07 ENCOUNTER — Other Ambulatory Visit: Payer: Self-pay

## 2020-03-07 ENCOUNTER — Encounter: Payer: Self-pay | Admitting: Orthopaedic Surgery

## 2020-03-07 VITALS — BP 154/97 | HR 83 | Ht 68.0 in | Wt 285.0 lb

## 2020-03-07 DIAGNOSIS — M65321 Trigger finger, right index finger: Secondary | ICD-10-CM | POA: Diagnosis not present

## 2020-03-07 DIAGNOSIS — M6530 Trigger finger, unspecified finger: Secondary | ICD-10-CM

## 2020-03-07 NOTE — Progress Notes (Signed)
Subjective:    Patient ID: Heather Mckinney, female    DOB: 08-Jan-1971, 49 y.o.   MRN: [id number]  HPI She has triggering of the right dominant index finger for about six weeks.  She went to the ER for this 02-25-2020.  I have reviewed the notes.  She was told she had a trigger finger. She works at [employer].  She is not getting any better. She has no redness or trauma.   Review of Systems  Constitutional: Positive for activity change.  Musculoskeletal: Positive for arthralgias.  All other systems reviewed and are negative.  For Review of Systems, all other systems reviewed and are negative.  The following is a summary of the past history medically, past history surgically, known current medicines, social history and family history.  This information is gathered electronically by the computer from prior information and documentation.  I review this each visit and have found including this information at this point in the chart is beneficial and informative.   Past Medical History:  Diagnosis Date  . Anxiety   . Arthritis   . COPD (chronic obstructive pulmonary disease) ([place])   . Depression   . Migraine     Past Surgical History:  Procedure Laterality Date  . CESAREAN SECTION    . CHOLECYSTECTOMY      Current Outpatient Medications on File Prior to Visit  Medication Sig Dispense Refill  . albuterol (PROVENTIL HFA;VENTOLIN HFA) 108 (90 Base) MCG/ACT inhaler Inhale 1-2 puffs into the lungs every 6 (six) hours as needed for wheezing or shortness of breath. 1 Inhaler 0  . Fremanezumab-vfrm (AJOVY) 225 MG/1.5ML SOAJ Inject 225 mg into the skin every 30 (thirty) days. 1 pen 11  . meclizine (ANTIVERT) 25 MG tablet Take 12.5 mg by mouth 3 (three) times daily as needed for dizziness.     . nortriptyline (PAMELOR) 10 MG capsule Take 1 capsule (10 mg total) by mouth at bedtime. 60 capsule 3  . Ubrogepant (UBRELVY) 100 MG TABS Take 100 mg by mouth every 2 (two) hours as needed. Maximum 200mg  a  day. 10 tablet 11   No current facility-administered medications on file prior to visit.    Social History   Socioeconomic History  . Marital status: Married    Spouse name: Not on file  . Number of children: 3  . Years of education: Not on file  . Highest education level: Some college, no degree  Occupational History  . Not on file  Tobacco Use  . Smoking status: Passive Smoke Exposure - Never Smoker  . Smokeless tobacco: Never Used  Substance and Sexual Activity  . Alcohol use: Yes    Comment: glass of wine once in awhile  . Drug use: Never  . Sexual activity: Not on file  Other Topics Concern  . Not on file  Social History Narrative   Lives at home with husband Remo Lipps   Right handed   Caffeine: 1-2 twenty oz Diet Dr [employer] day   Social Determinants of Health   Financial Resource Strain:   . Difficulty of Paying Living Expenses: Not on file  Food Insecurity:   . Worried About Charity fundraiser in the Last Year: Not on file  . Ran Out of Food in the Last Year: Not on file  Transportation Needs:   . Lack of Transportation (Medical): Not on file  . Lack of Transportation (Non-Medical): Not on file  Physical Activity:   . Days of Exercise per Week: Not  on file  . Minutes of Exercise per Session: Not on file  Stress:   . Feeling of Stress : Not on file  Social Connections:   . Frequency of Communication with Friends and Family: Not on file  . Frequency of Social Gatherings with Friends and Family: Not on file  . Attends Religious Services: Not on file  . Active Member of Clubs or Organizations: Not on file  . Attends Banker Meetings: Not on file  . Marital Status: Not on file  Intimate Partner Violence:   . Fear of Current or Ex-Partner: Not on file  . Emotionally Abused: Not on file  . Physically Abused: Not on file  . Sexually Abused: Not on file    Family History  Problem Relation Age of Onset  . Diabetes Mother   . Hypertension Mother     . Asthma Mother   . Cerebral aneurysm Mother   . Heart Problems Mother   . Migraines Mother   . Migraines Daughter   . Vision loss Daughter   . Hydrocephalus Son     BP (!) 154/97   Pulse 83   Ht 5\' 8"  (1.727 m)   Wt 285 lb (129.3 kg)   BMI 43.33 kg/m   Body mass index is 43.33 kg/m.     Objective:   Physical Exam Vitals and nursing note reviewed.  Constitutional:      Appearance: She is well-developed.  HENT:     Head: Normocephalic and atraumatic.  Eyes:     Conjunctiva/sclera: Conjunctivae normal.     Pupils: Pupils are equal, round, and reactive to light.  Cardiovascular:     Rate and Rhythm: Normal rate and regular rhythm.  Pulmonary:     Effort: Pulmonary effort is normal.  Abdominal:     Palpations: Abdomen is soft.  Musculoskeletal:       Hands:     Cervical back: Normal range of motion and neck supple.  Skin:    General: Skin is warm and dry.  Neurological:     Mental Status: She is alert and oriented to person, place, and time.     Cranial Nerves: No cranial nerve deficit.     Motor: No abnormal muscle tone.     Coordination: Coordination normal.     Deep Tendon Reflexes: Reflexes are normal and symmetric. Reflexes normal.  Psychiatric:        Behavior: Behavior normal.        Thought Content: Thought content normal.        Judgment: Judgment normal.           Assessment & Plan:   Encounter Diagnosis  Name Primary?  . Trigger finger, acquired Yes   PROCEDURE  Trigger Finger Injection  The right Index finger  has been locking at the A1 pulley.  The patient has been told about injection of the digit.  Surgical correction and excision of the A1 pulley will resolve the problem.  Ani injection in the digit should help but the results may be short lived.  The patient asked appropriate questions and understands the procedure.  The patient has elected for an injection at this time.  Verbal consent was obtained.  A timeout was taken to confirm  the proper hand and digit.  Medication  1 mL of 40 mg Depo-Medrol  2 mL of 1% lidocaine plain  Ethyl chloride for anesthesia  Alcohol was used to prepare the skin along with ethyl chloride and then the  injection was made at the A1 pulley there were no complications.  It was tolerated well.  A Band-aid dressing was applied.  Call if any problem or difficulty.  Return in two weeks.  Surgery may be needed.  Electronically Signed Darreld Mclean, MD 3/9/20213:38 PM

## 2020-03-16 ENCOUNTER — Encounter: Payer: Self-pay | Admitting: *Deleted

## 2020-03-21 ENCOUNTER — Other Ambulatory Visit: Payer: Self-pay

## 2020-03-21 ENCOUNTER — Encounter: Payer: Self-pay | Admitting: Orthopaedic Surgery

## 2020-03-21 ENCOUNTER — Ambulatory Visit: Payer: BC Managed Care – PPO | Admitting: Orthopaedic Surgery

## 2020-03-21 VITALS — BP 144/92 | HR 80 | Temp 98.0°F | Ht 68.0 in | Wt 291.1 lb

## 2020-03-21 DIAGNOSIS — M6530 Trigger finger, unspecified finger: Secondary | ICD-10-CM | POA: Diagnosis not present

## 2020-03-21 DIAGNOSIS — Z6841 Body Mass Index (BMI) 40.0 and over, adult: Secondary | ICD-10-CM

## 2020-03-21 NOTE — Progress Notes (Signed)
Heather Mckinney, female DOB:1971/11/20, 49 y.o. WUJ:[id number]  Chief Complaint  Heather presents with  . Hand Pain    R/ hurting/shot helped some for a day or two    HPI  Heather Mckinney is a 49 y.o. female who has recurrent locking of the right index finger, a trigger finger.  The injection helped a short time but it has returned triggering.  She would like to have surgery. I will arrange for her to see Dr. Romeo Apple for out Heather procedure.   Body mass index is 44.27 kg/m.  ROS  Review of Systems  Constitutional: Positive for activity change.  Musculoskeletal: Positive for arthralgias.  All other systems reviewed and are negative.   All other systems reviewed and are negative.  The following is a summary of the past history medically, past history surgically, known current medicines, social history and family history.  This information is gathered electronically by the computer from prior information and documentation.  I review this each visit and have found including this information at this point in the chart is beneficial and informative.    Past Medical History:  Diagnosis Date  . Anxiety   . Arthritis   . COPD (chronic obstructive pulmonary disease) (HCC)   . Depression   . Migraine     Past Surgical History:  Procedure Laterality Date  . CESAREAN SECTION    . CHOLECYSTECTOMY      Family History  Problem Relation Age of Onset  . Diabetes Mother   . Hypertension Mother   . Asthma Mother   . Cerebral aneurysm Mother   . Heart Problems Mother   . Migraines Mother   . Migraines Daughter   . Vision loss Daughter   . Hydrocephalus Son     Social History Social History   Tobacco Use  . Smoking status: Passive Smoke Exposure - Never Smoker  . Smokeless tobacco: Never Used  Substance Use Topics  . Alcohol use: Yes    Comment: glass of wine once in awhile  . Drug use: Never    Allergies  Allergen Reactions  . Ephedrine Other (See  Comments)    Heather states she went into cardiac arrest after taking sudafed.  . Aspirin Other (See Comments)    Hearing loss     Current Outpatient Medications  Medication Sig Dispense Refill  . albuterol (PROVENTIL HFA;VENTOLIN HFA) 108 (90 Base) MCG/ACT inhaler Inhale 1-2 puffs into the lungs every 6 (six) hours as needed for wheezing or shortness of breath. 1 Inhaler 0  . Fremanezumab-vfrm (AJOVY) 225 MG/1.5ML SOAJ Inject 225 mg into the skin every 30 (thirty) days. 1 pen 11  . meclizine (ANTIVERT) 25 MG tablet Take 12.5 mg by mouth 3 (three) times daily as needed for dizziness.     . nortriptyline (PAMELOR) 10 MG capsule Take 1 capsule (10 mg total) by mouth at bedtime. 60 capsule 3  . Ubrogepant (UBRELVY) 100 MG TABS Take 100 mg by mouth every 2 (two) hours as needed. Maximum 200mg  a day. 10 tablet 11   No current facility-administered medications for this visit.     Physical Exam  Blood pressure (!) 144/92, pulse 80, temperature 98 F (36.7 C), height 5\' 8"  (1.727 m), weight 291 lb 2 oz (132.1 kg).  Constitutional: overall normal hygiene, normal nutrition, well developed, normal grooming, normal body habitus. Assistive device:none  Musculoskeletal: gait and station Limp none, muscle tone and strength are normal, no tremors or atrophy is present.  .  Neurological:  coordination overall normal.  Deep tendon reflex/nerve stretch intact.  Sensation normal.  Cranial nerves II-XII intact.   Skin:   Normal overall no scars, lesions, ulcers or rashes. No psoriasis.  Psychiatric: Alert and oriented x 3.  Recent memory intact, remote memory unclear.  Normal mood and affect. Well groomed.  Good eye contact.  Cardiovascular: overall no swelling, no varicosities, no edema bilaterally, normal temperatures of the legs and arms, no clubbing, cyanosis and good capillary refill.  Lymphatic: palpation is normal.  She has locking of right index trigger finger.  NV intact.  All other  systems reviewed and are negative   The Heather has been educated about the nature of the problem(s) and counseled on treatment options.  The Heather appeared to understand what I have discussed and is in agreement with it.  Encounter Diagnoses  Name Primary?  . Trigger finger, acquired Yes  . Body mass index 40.0-44.9, adult ([place])   . Morbid obesity ([place])     PLAN Call if any problems.  Precautions discussed.  Continue current medications.   Return to clinic to see Dr. Aline Brochure   Electronically Signed Heather Kava, MD 3/23/20213:23 PM

## 2020-03-28 ENCOUNTER — Ambulatory Visit: Payer: BC Managed Care – PPO | Admitting: Orthopedic Surgery

## 2020-03-29 DIAGNOSIS — E6609 Other obesity due to excess calories: Secondary | ICD-10-CM | POA: Diagnosis not present

## 2020-03-29 DIAGNOSIS — G43001 Migraine without aura, not intractable, with status migrainosus: Secondary | ICD-10-CM | POA: Diagnosis not present

## 2020-03-29 DIAGNOSIS — Z136 Encounter for screening for cardiovascular disorders: Secondary | ICD-10-CM | POA: Diagnosis not present

## 2020-03-29 DIAGNOSIS — H814 Vertigo of central origin: Secondary | ICD-10-CM | POA: Diagnosis not present

## 2020-03-29 DIAGNOSIS — E782 Mixed hyperlipidemia: Secondary | ICD-10-CM | POA: Diagnosis not present

## 2020-04-03 ENCOUNTER — Ambulatory Visit: Payer: BC Managed Care – PPO | Admitting: Neurology

## 2020-04-03 DIAGNOSIS — Z0001 Encounter for general adult medical examination with abnormal findings: Secondary | ICD-10-CM | POA: Diagnosis not present

## 2020-05-03 DIAGNOSIS — F321 Major depressive disorder, single episode, moderate: Secondary | ICD-10-CM | POA: Diagnosis not present

## 2020-06-02 DIAGNOSIS — F411 Generalized anxiety disorder: Secondary | ICD-10-CM | POA: Diagnosis not present

## 2020-06-02 DIAGNOSIS — F321 Major depressive disorder, single episode, moderate: Secondary | ICD-10-CM | POA: Diagnosis not present

## 2020-06-02 DIAGNOSIS — F909 Attention-deficit hyperactivity disorder, unspecified type: Secondary | ICD-10-CM | POA: Diagnosis not present

## 2020-11-09 ENCOUNTER — Telehealth: Payer: Self-pay | Admitting: *Deleted

## 2020-11-09 NOTE — Telephone Encounter (Signed)
I called [facility] pharmacy. They said the prescription for Ajovy was transferred out. They still have the pt's insurance listed as [employer] with same ID number as we had on Ajovy PA back in January 2021. I called pt and LVM asking for call back.

## 2020-11-09 NOTE — Telephone Encounter (Signed)
Received another PA via Cover My Meds. Key: [id number] - Rx #: [id number].  Plan: [employer]. I tried to complete online but received a message stating the member's plan should be contacted. There was no number provided and the pharmacy was closed. Will try again later.

## 2020-11-11 ENCOUNTER — Other Ambulatory Visit: Payer: Self-pay | Admitting: Neurology

## 2020-11-11 DIAGNOSIS — H539 Unspecified visual disturbance: Secondary | ICD-10-CM

## 2020-11-11 DIAGNOSIS — G4484 Primary exertional headache: Secondary | ICD-10-CM

## 2020-11-11 DIAGNOSIS — H93A9 Pulsatile tinnitus, unspecified ear: Secondary | ICD-10-CM

## 2020-11-11 DIAGNOSIS — R519 Headache, unspecified: Secondary | ICD-10-CM

## 2020-11-11 DIAGNOSIS — G43101 Migraine with aura, not intractable, with status migrainosus: Secondary | ICD-10-CM

## 2020-11-11 DIAGNOSIS — G43711 Chronic migraine without aura, intractable, with status migrainosus: Secondary | ICD-10-CM

## 2020-11-11 DIAGNOSIS — R51 Headache with orthostatic component, not elsewhere classified: Secondary | ICD-10-CM

## 2020-11-27 ENCOUNTER — Other Ambulatory Visit: Payer: Self-pay | Admitting: Neurology

## 2020-11-27 DIAGNOSIS — R51 Headache with orthostatic component, not elsewhere classified: Secondary | ICD-10-CM

## 2020-11-27 DIAGNOSIS — R519 Headache, unspecified: Secondary | ICD-10-CM

## 2020-11-27 DIAGNOSIS — H539 Unspecified visual disturbance: Secondary | ICD-10-CM

## 2020-11-27 DIAGNOSIS — G43711 Chronic migraine without aura, intractable, with status migrainosus: Secondary | ICD-10-CM

## 2020-11-27 DIAGNOSIS — G4484 Primary exertional headache: Secondary | ICD-10-CM

## 2020-11-27 DIAGNOSIS — G43101 Migraine with aura, not intractable, with status migrainosus: Secondary | ICD-10-CM

## 2020-11-27 DIAGNOSIS — H93A9 Pulsatile tinnitus, unspecified ear: Secondary | ICD-10-CM

## 2020-11-28 ENCOUNTER — Telehealth: Payer: Self-pay | Admitting: Neurology

## 2020-11-28 NOTE — Telephone Encounter (Signed)
Cover My Meds Vernona Rieger) called PA for Ubrogepant (UBRELVY) 100 MG TABS was submitted to the plan. The plan is requesting additional information to complete process. Can contact [employer] [phone], reference key: [id number]

## 2020-11-28 NOTE — Telephone Encounter (Addendum)
Called [employer], spoke with Annabelle Harman. She stated [employer] doesn't handle benefits for patient. Vernell Leep is benefits company, [phone]. Call transferred, spoke with [place] who stated PA not started. PA started over phone, information sent to review. Turn around up to 2 days, notified via fax. Case Ref # [id number].

## 2020-12-04 ENCOUNTER — Encounter: Payer: Self-pay | Admitting: *Deleted

## 2020-12-04 NOTE — Telephone Encounter (Addendum)
Called [employer] Benefits Dept, spoke with  Heather Mckinney who stated Heather Mckinney was denied. She must show trial, faliure to 2 oral  Triptans. I advised him that per Jan 2021 notes this was denied previously. Patient is using savings card. I will verify with her again.  Thanked him for assistance. Sent her my chart.

## 2021-02-03 ENCOUNTER — Other Ambulatory Visit: Payer: Self-pay | Admitting: Neurology

## 2021-03-07 ENCOUNTER — Encounter: Payer: Self-pay | Admitting: *Deleted

## 2021-03-14 ENCOUNTER — Telehealth: Payer: Self-pay | Admitting: Neurology

## 2021-03-14 NOTE — Telephone Encounter (Signed)
This medication is provided through Dr Lucia Gaskins but given the previous message from her RN the patient needs a yearly visit before a PA can be completed for the patient.

## 2021-03-14 NOTE — Telephone Encounter (Signed)
Erin@Cover  My [employer]s called to inform provider needs to call the plan directly ([employer] [phone]) to complete the PA.  If Cover My Meds needed to be called back please use [phone] Ref Key:[id number]

## 2021-06-13 ENCOUNTER — Other Ambulatory Visit: Payer: Self-pay | Admitting: Neurology

## 2021-08-04 ENCOUNTER — Encounter (HOSPITAL_COMMUNITY): Payer: Self-pay | Admitting: Emergency Medicine

## 2021-08-04 ENCOUNTER — Emergency Department (HOSPITAL_COMMUNITY)
Admission: EM | Admit: 2021-08-04 | Discharge: 2021-08-04 | Disposition: A | Discharge status: Home / Self Care | Payer: BC Managed Care – PPO | Attending: Emergency Medicine | Admitting: Emergency Medicine

## 2021-08-04 ENCOUNTER — Emergency Department (HOSPITAL_COMMUNITY): Payer: BC Managed Care – PPO

## 2021-08-04 ENCOUNTER — Other Ambulatory Visit: Payer: Self-pay

## 2021-08-04 DIAGNOSIS — Z7722 Contact with and (suspected) exposure to environmental tobacco smoke (acute) (chronic): Secondary | ICD-10-CM | POA: Diagnosis not present

## 2021-08-04 DIAGNOSIS — Y92524 Gas station as the place of occurrence of the external cause: Secondary | ICD-10-CM | POA: Diagnosis not present

## 2021-08-04 DIAGNOSIS — M25561 Pain in right knee: Secondary | ICD-10-CM | POA: Insufficient documentation

## 2021-08-04 DIAGNOSIS — Y9301 Activity, walking, marching and hiking: Secondary | ICD-10-CM | POA: Diagnosis not present

## 2021-08-04 DIAGNOSIS — J449 Chronic obstructive pulmonary disease, unspecified: Secondary | ICD-10-CM | POA: Insufficient documentation

## 2021-08-04 MED ORDER — MELOXICAM 7.5 MG PO TABS: 7.5000 mg | ORAL_TABLET | Freq: Two times a day (BID) | ORAL | 0 refills | Status: AC | PRN

## 2021-08-04 MED ORDER — KETOROLAC TROMETHAMINE 60 MG/2ML IM SOLN
60.0000 mg | Freq: Once | INTRAMUSCULAR | Status: AC
  Administered 2021-08-04: 60 mg via INTRAMUSCULAR
  Filled 2021-08-04: qty 2

## 2021-08-04 NOTE — ED Triage Notes (Signed)
Patient c/o right knee pain. Per patient working yesterday and possible turned the wrong way causing instant pain. Patient reports taking Aleve yesterday for pain with no relief.

## 2021-08-04 NOTE — ED Provider Notes (Signed)
[facility] EMERGENCY DEPARTMENT Provider Note   CSN: [id number] Arrival date & time: 08/04/21  5277     History Chief Complaint  Patient presents with   Knee Pain    Heather Mckinney is a 50 y.o. female.   Knee Pain  This patient is a 50 year old female history of COPD, she has a history of chronic pain in her knees, she was at work yesterday when she was walking and felt pain in her right anterior knee with associated swelling.  No fevers or chills, this pain is worse with ambulating, associated with some swelling.  She denies prior surgery or specific injury to this area.  She works at a gas station, the pain is sharp and stabbing, she went home from work, she has taken some anti-inflammatories without improvement.  Past Medical History:  Diagnosis Date   Anxiety    Arthritis    COPD (chronic obstructive pulmonary disease) (HCC)    Depression    Migraine     Patient Active Problem List   Diagnosis Date Noted   Snoring 02/23/2020   Intractable episodic cluster headache 01/17/2020   Sleep related headaches 01/17/2020   Class 3 severe obesity due to excess calories with serious comorbidity and body mass index (BMI) of 40.0 to 44.9 in adult [facility]) 01/17/2020   Obesity with alveolar hypoventilation and body mass index (BMI) of 40 or greater (HCC) 01/17/2020   Pulmonary emphysema (HCC) 01/17/2020   Chronic migraine without aura, with intractable migraine, so stated, with status migrainosus 01/03/2020   Migraine with aura and with status migrainosus, not intractable 01/03/2020    Past Surgical History:  Procedure Laterality Date   CESAREAN SECTION     CHOLECYSTECTOMY       OB History     Gravida  3   Para  3   Term  3   Preterm      AB      Living  3      SAB      IAB      Ectopic      Multiple      Live Births              Family History  Problem Relation Age of Onset   Diabetes Mother    Hypertension Mother    Asthma Mother    Cerebral  aneurysm Mother    Heart Problems Mother    Migraines Mother    Migraines Daughter    Vision loss Daughter    Hydrocephalus Son     Social History   Tobacco Use   Smoking status: Never    Passive exposure: Yes   Smokeless tobacco: Never  Vaping Use   Vaping Use: Never used  Substance Use Topics   Alcohol use: Yes    Comment: glass of wine once in awhile   Drug use: Never    Home Medications Prior to Admission medications   Medication Sig Start Date End Date Taking? Authorizing Provider  meloxicam (MOBIC) 7.5 MG tablet Take 1 tablet (7.5 mg total) by mouth 2 (two) times daily as needed for up to 14 days for pain. 08/04/21 08/18/21 Yes Eber Hong, MD  AJOVY 225 MG/1.5ML SOSY INJECT 225 MG INTO THE SKIN EVERY 30 DAYS 02/04/21   Anson Fret, MD  albuterol (PROVENTIL HFA;VENTOLIN HFA) 108 (90 Base) MCG/ACT inhaler Inhale 1-2 puffs into the lungs every 6 (six) hours as needed for wheezing or shortness of breath. 02/23/19   Keenan Bachelor,  Lonia Skinner, PA-C  Fremanezumab-vfrm (AJOVY) 225 MG/1.5ML SOAJ Inject 225 mg into the skin every 30 (thirty) days. 01/03/20   Anson Fret, MD  meclizine (ANTIVERT) 25 MG tablet Take 12.5 mg by mouth 3 (three) times daily as needed for dizziness.     [provider]  nortriptyline (PAMELOR) 10 MG capsule Take 1 capsule (10 mg total) by mouth at bedtime. Appointment needed for further refills. Call [phone] to schedule. 11/13/20   Anson Fret, MD  UBRELVY 100 MG TABS TAKE 1 TABLET BY MOUTH EVERY 2 HOURS AS NEEDED (MAX OF 200 MG A DAY) 02/04/21   Anson Fret, MD    Allergies    Ephedrine and Aspirin  Review of Systems   Review of Systems  Musculoskeletal:  Positive for joint swelling.  Skin:  Negative for rash and wound.  Neurological:  Negative for weakness and numbness.   Physical Exam Updated Vital Signs BP (!) 147/74 (BP Location: Right Arm)   Pulse 67   Temp 98.2 F (36.8 C) (Oral)   Resp 18   Ht 1.727 m (5\' 8" )   Wt  132.5 kg   SpO2 100%   BMI 44.40 kg/m   Physical Exam Vitals and nursing note reviewed.  Constitutional:      Appearance: She is well-developed. She is not diaphoretic.  HENT:     Head: Normocephalic and atraumatic.  Eyes:     General:        Right eye: No discharge.        Left eye: No discharge.     Conjunctiva/sclera: Conjunctivae normal.  Pulmonary:     Effort: Pulmonary effort is normal. No respiratory distress.  Musculoskeletal:        General: Tenderness present.     Comments: Normal-appearing lower extremities except for mild swelling of the right knee, there is no warmth or overlying redness.  She is able to fully extend and lift her right leg in a straight position, she is able to flex the knee but with some pain.  Skin:    General: Skin is warm and dry.     Findings: No erythema or rash.  Neurological:     Mental Status: She is alert.     Coordination: Coordination normal.     Comments: Normal speech and coordination with the bilateral upper extremities, she is able to straight leg raise bilaterally    ED Results / Procedures / Treatments   Labs (all labs ordered are listed, but only abnormal results are displayed) Labs Reviewed - No data to display  EKG None  Radiology DG Knee Complete 4 Views Right  Result Date: 08/04/2021 CLINICAL DATA:  Acute RIGHT knee pain from injury yesterday. Initial encounter. EXAM: RIGHT KNEE - COMPLETE 4+ VIEW COMPARISON:  07/03/2017 and prior radiographs FINDINGS: Tricompartmental joint space narrowing and osteophytic spurring again identified. There is no evidence of acute fracture, subluxation or dislocation. No knee effusion is noted. No focal bony lesions are present. IMPRESSION: 1. No evidence of acute abnormality. 2. Tricompartmental degenerative changes. Electronically Signed   By: 09/03/2017 M.D.   On: 08/04/2021 08:28    Procedures Procedures   Medications Ordered in ED Medications  ketorolac (TORADOL) injection 60 mg  (60 mg Intramuscular Given 08/04/21 10/04/21)    ED Course  I have reviewed the triage vital signs and the nursing notes.  Pertinent labs & imaging results that were available during my care of the patient were reviewed by me and  considered in my medical decision making (see chart for details).    MDM Rules/Calculators/A&P                           The patient is not in distress but has some swelling and pain to the right knee which is possibly related to a joint effusion, that being said with the patient's body habitus and obesity it is difficult to tell any asymmetry between the 2 lower extremities.  No fever or tachycardia to suggest that this is a septic joint, she has no knee pain in the past, suspect there is some internal abnormality with a likely effusion, x-rays will be obtained due to the patient's history of chronic pain to make sure there is no subtle or occult fractures, anti-inflammatories and follow-up, patient agreeable  X-ray is normal, patient given anti-inflammatory, vital signs normal, has significant arthritis in the knee, needs follow-up with orthopedics, patient  Final Clinical Impression(s) / ED Diagnoses Final diagnoses:  Acute pain of right knee    Rx / DC Orders ED Discharge Orders          Ordered    meloxicam (MOBIC) 7.5 MG tablet  2 times daily PRN        08/04/21 0837             Eber Hong, MD 08/04/21 [id number]

## 2021-08-04 NOTE — Discharge Instructions (Addendum)
Your x-rays showed arthritis but no signs of fracture  Please take Mobic - 7.5mg  by mouth twice daily as needed for pain - this in an antiinflammatory medicine (NSAID) and is similar to ibuprofen - many people feel that it is stronger than ibuprofen and it is easier to take since it is a smaller pill.  Please use this only for 1 week - if your pain persists, you will need to follow up with your doctor in the office for ongoing guidance and pain control.    Crutches for comfort Orthopedic follow up if pain continues  ER for worsening symptoms  Thank you for letting us take care of you today!  Please obtain all of your results from medical records or have your doctors office obtain the results - share them with your doctor - you should be seen at your doctors office in the next 2 days. Call today to arrange your follow up. Take the medications as prescribed. Please review all of the medicines and only take them if you do not have an allergy to them. Please be aware that if you are taking birth control pills, taking other prescriptions, ESPECIALLY ANTIBIOTICS may make the birth control ineffective - if this is the case, either do not engage in sexual activity or use alternative methods of birth control such as condoms until you have finished the medicine and your family doctor says it is OK to restart them. If you are on a blood thinner such as COUMADIN, be aware that any other medicine that you take may cause the coumadin to either work too much, or not enough - you should have your coumadin level rechecked in next 7 days if this is the case.  ?  It is also a possibility that you have an allergic reaction to any of the medicines that you have been prescribed - Everybody reacts differently to medications and while MOST people have no trouble with most medicines, you may have a reaction such as nausea, vomiting, rash, swelling, shortness of breath. If this is the case, please stop taking the medicine  immediately and contact your physician.   If you were given a medication in the ED such as percocet, vicodin, or morphine, be aware that these medicines are sedating and may change your ability to take care of yourself adequately for several hours after being given this medicines - you should not drive or take care of small children if you were given this medicine in the Emergency Department or if you have been prescribed these types of medicines. ?   You should return to the ER IMMEDIATELY if you develop severe or worsening symptoms.

## 2022-04-03 DIAGNOSIS — F331 Major depressive disorder, recurrent, moderate: Secondary | ICD-10-CM | POA: Diagnosis not present

## 2022-04-03 DIAGNOSIS — Z136 Encounter for screening for cardiovascular disorders: Secondary | ICD-10-CM | POA: Diagnosis not present

## 2022-04-03 DIAGNOSIS — Z13220 Encounter for screening for lipoid disorders: Secondary | ICD-10-CM | POA: Diagnosis not present

## 2022-04-03 DIAGNOSIS — Z23 Encounter for immunization: Secondary | ICD-10-CM | POA: Diagnosis not present

## 2022-04-03 DIAGNOSIS — R7301 Impaired fasting glucose: Secondary | ICD-10-CM | POA: Diagnosis not present

## 2022-04-03 DIAGNOSIS — Z0000 Encounter for general adult medical examination without abnormal findings: Secondary | ICD-10-CM | POA: Diagnosis not present

## 2022-04-03 DIAGNOSIS — Z1211 Encounter for screening for malignant neoplasm of colon: Secondary | ICD-10-CM | POA: Diagnosis not present

## 2022-04-03 DIAGNOSIS — Z01419 Encounter for gynecological examination (general) (routine) without abnormal findings: Secondary | ICD-10-CM | POA: Diagnosis not present

## 2022-04-23 DIAGNOSIS — Z1211 Encounter for screening for malignant neoplasm of colon: Secondary | ICD-10-CM | POA: Diagnosis not present

## 2022-05-02 LAB — COLOGUARD: COLOGUARD: NEGATIVE

## 2022-05-02 LAB — EXTERNAL GENERIC LAB PROCEDURE: COLOGUARD: NEGATIVE

## 2022-05-22 DIAGNOSIS — F411 Generalized anxiety disorder: Secondary | ICD-10-CM | POA: Diagnosis not present

## 2022-05-22 DIAGNOSIS — B351 Tinea unguium: Secondary | ICD-10-CM | POA: Diagnosis not present

## 2022-05-22 DIAGNOSIS — F331 Major depressive disorder, recurrent, moderate: Secondary | ICD-10-CM | POA: Diagnosis not present

## 2022-05-22 IMAGING — DX DG KNEE COMPLETE 4+V*R*
4 series · 4 of 4 positions shown · non-contrast
Comparison: 07/03/2017 and prior radiographs

CLINICAL DATA: Acute RIGHT knee pain from injury yesterday. Initial
encounter.

EXAM:
RIGHT KNEE - COMPLETE 4+ VIEW

[knee ap]
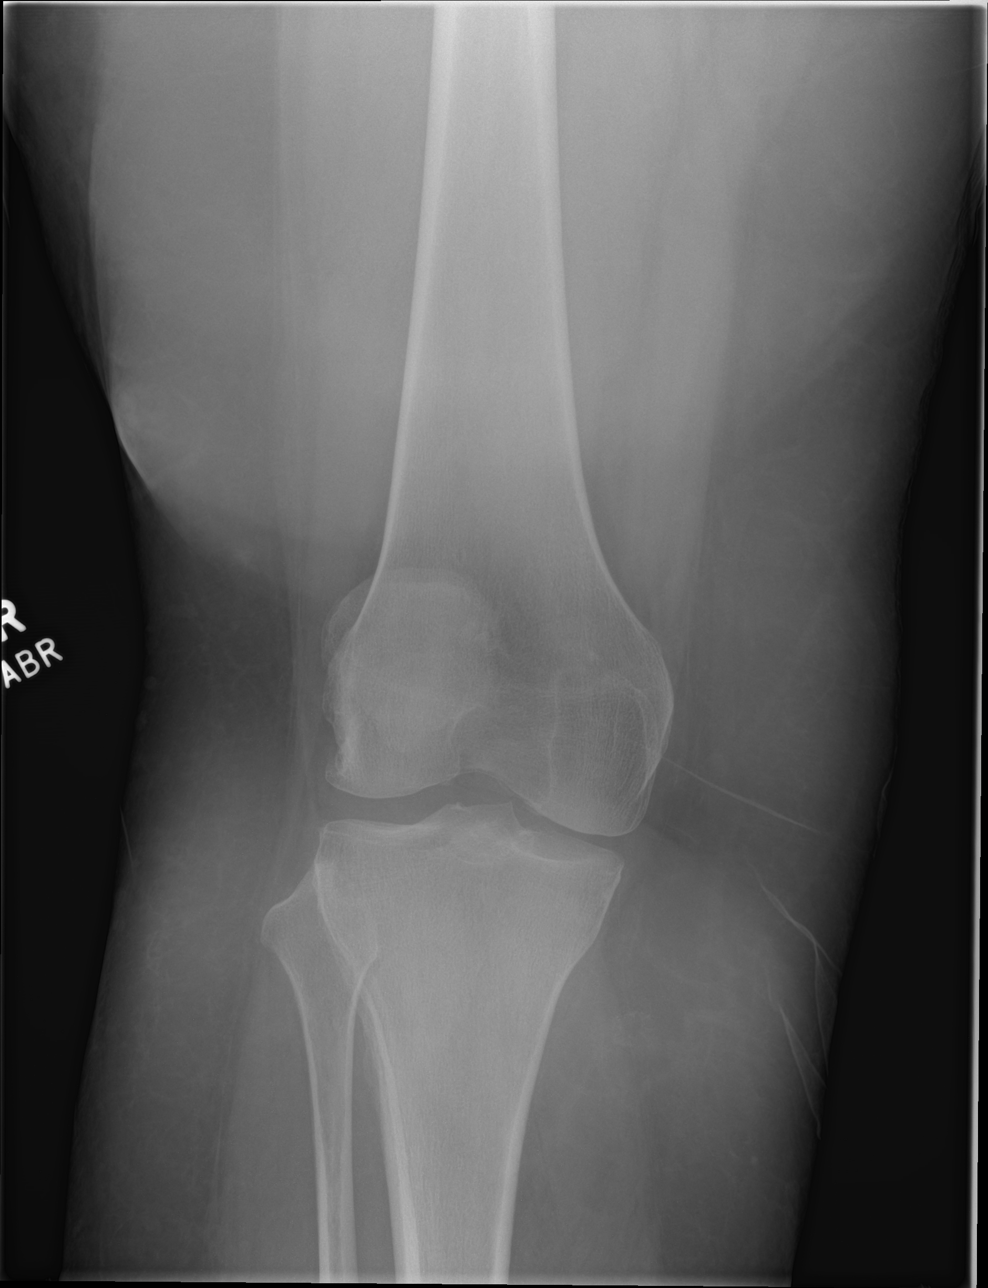

[knee lat]
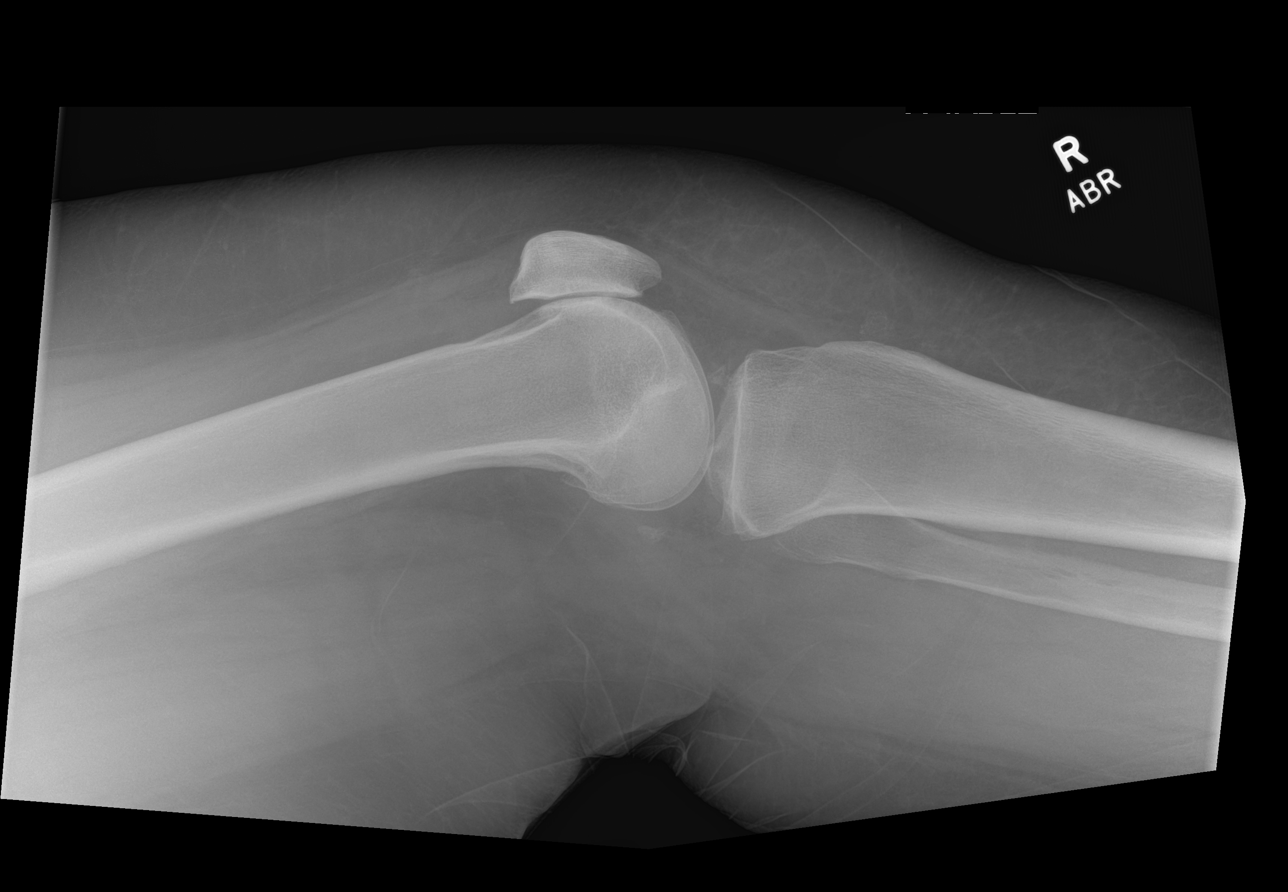

[knee obl (1 of 2)]
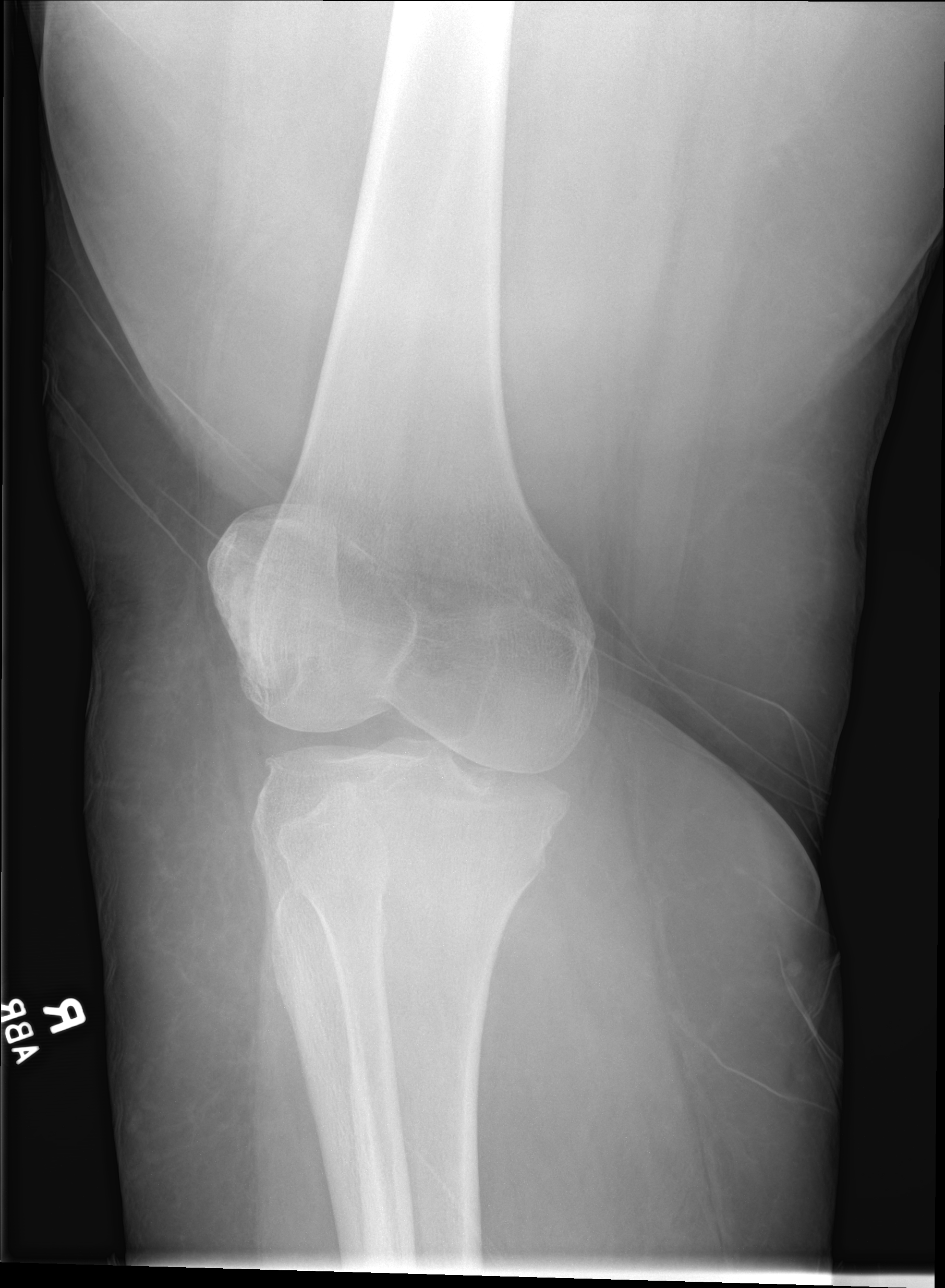

[knee obl (2 of 2)]
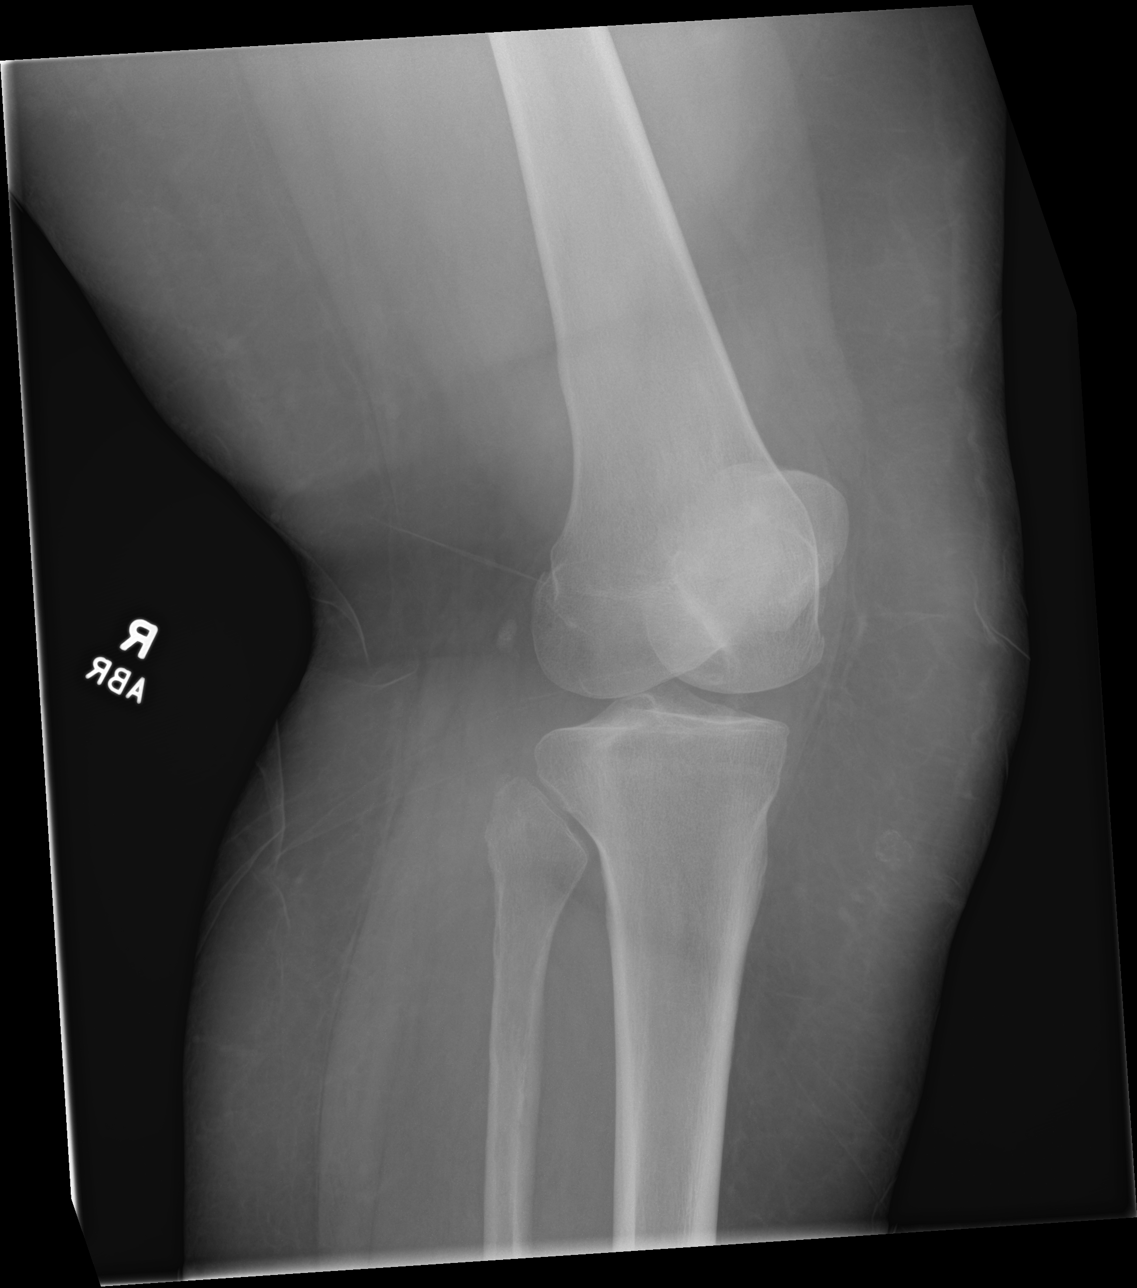

[4 of 4 positions shown; findings below may reference images not displayed]

FINDINGS: Tricompartmental joint space narrowing and osteophytic spurring
again identified.

There is no evidence of acute fracture, subluxation or dislocation.

No knee effusion is noted.

No focal bony lesions are present.
IMPRESSION: 1. No evidence of acute abnormality.
2. Tricompartmental degenerative changes.

## 2022-07-08 ENCOUNTER — Emergency Department (HOSPITAL_COMMUNITY)
Admission: EM | Admit: 2022-07-08 | Discharge: 2022-07-08 | Disposition: A | Discharge status: Home / Self Care | Payer: BC Managed Care – PPO | Attending: Emergency Medicine | Admitting: Emergency Medicine

## 2022-07-08 ENCOUNTER — Encounter (HOSPITAL_COMMUNITY): Payer: Self-pay

## 2022-07-08 ENCOUNTER — Emergency Department (HOSPITAL_COMMUNITY): Payer: BC Managed Care – PPO

## 2022-07-08 ENCOUNTER — Other Ambulatory Visit: Payer: Self-pay

## 2022-07-08 DIAGNOSIS — M79672 Pain in left foot: Secondary | ICD-10-CM | POA: Insufficient documentation

## 2022-07-08 DIAGNOSIS — M79671 Pain in right foot: Secondary | ICD-10-CM

## 2022-07-08 MED ORDER — TRAMADOL HCL 50 MG PO TABS: 50.0000 mg | ORAL_TABLET | Freq: Four times a day (QID) | ORAL | 0 refills | Status: DC | PRN

## 2022-07-08 NOTE — ED Triage Notes (Signed)
Patient with complaints of bilateral foot pain for a few months.

## 2022-07-08 NOTE — ED Notes (Signed)
Pt reports pain across top of both feet, pt states pain is worse on right. Employer wanted her to come get checked out to ensure she didn't have a stress fracture.

## 2022-07-08 NOTE — Discharge Instructions (Addendum)
Your x-ray did not show evidence of a stress fracture.  You do have some arthritis of your big toe.  It is important that you wear supportive shoes.  Do not walk around barefooted.  Continue to take Advil daily as directed with food.  You may take the prescription tramadol at night as needed for pain.  Please follow-up with your primary care provider for recheck or with the podiatrist listed.

## 2022-07-09 NOTE — ED Provider Notes (Signed)
[facility] EMERGENCY DEPARTMENT Provider Note   CSN: [id number] Arrival date & time: 07/08/22  2035     History  Chief Complaint  Patient presents with   Foot Pain    Heather Mckinney is a 51 y.o. female.   Foot Pain Pertinent negatives include no chest pain, no abdominal pain and no shortness of breath.       Heather Mckinney is a 51 y.o. female who presents to the Emergency Department complaining of bilateral foot pain present for several months.  States pain of the right foot is greater than left.  She stands on concrete floors all day at her job.  Describes aching pain to the distal foot near the base of the toes.  Pain worse with standing and walking.  No known injury.  She denies any numbness or tingling pain or swelling proximal to the foot.  Is here stating she is concerned that she has a stress fracture of her foot.    Home Medications Prior to Admission medications   Medication Sig Start Date End Date Taking? Authorizing Provider  traMADol (ULTRAM) 50 MG tablet Take 1 tablet (50 mg total) by mouth every 6 (six) hours as needed. 07/08/22  Yes Chioma Mukherjee, PA-C  AJOVY 225 MG/1.5ML SOSY INJECT 225 MG INTO THE SKIN EVERY 30 DAYS 02/04/21   Anson Fret, MD  albuterol (PROVENTIL HFA;VENTOLIN HFA) 108 (90 Base) MCG/ACT inhaler Inhale 1-2 puffs into the lungs every 6 (six) hours as needed for wheezing or shortness of breath. 02/23/19   Elson Areas, PA-C  Fremanezumab-vfrm (AJOVY) 225 MG/1.5ML SOAJ Inject 225 mg into the skin every 30 (thirty) days. 01/03/20   Anson Fret, MD  meclizine (ANTIVERT) 25 MG tablet Take 12.5 mg by mouth 3 (three) times daily as needed for dizziness.     [provider]  nortriptyline (PAMELOR) 10 MG capsule Take 1 capsule (10 mg total) by mouth at bedtime. Appointment needed for further refills. Call [phone] to schedule. 11/13/20   Anson Fret, MD  UBRELVY 100 MG TABS TAKE 1 TABLET BY MOUTH EVERY 2 HOURS AS NEEDED (MAX  OF 200 MG A DAY) 02/04/21   Anson Fret, MD      Allergies    Ephedrine and Aspirin    Review of Systems   Review of Systems  Constitutional:  Negative for chills and fever.  Respiratory:  Negative for cough, chest tightness and shortness of breath.   Cardiovascular:  Negative for chest pain.  Gastrointestinal:  Negative for abdominal pain, nausea and vomiting.  Musculoskeletal:  Positive for arthralgias (Bilateral foot pain). Negative for back pain.  Skin:  Negative for color change and rash.  Neurological:  Negative for weakness and numbness.    Physical Exam Updated Vital Signs BP (!) 155/95 (BP Location: Right Arm)   Pulse 90   Temp 98.4 F (36.9 C) (Oral)   Resp 20   Ht 5\' 8"  (1.727 m)   Wt 130.2 kg   LMP 12/25/2019   SpO2 99%   BMI 43.64 kg/m  Physical Exam Vitals and nursing note reviewed.  Constitutional:      General: She is not in acute distress.    Appearance: Normal appearance.  Cardiovascular:     Rate and Rhythm: Normal rate and regular rhythm.     Pulses: Normal pulses.  Pulmonary:     Effort: Pulmonary effort is normal. No respiratory distress.  Musculoskeletal:        General: Tenderness present.  No swelling or signs of injury.     Right lower leg: No edema.     Left lower leg: No edema.     Comments: Tenderness to the distal right foot.  No significant tenderness with range of motion of the left foot.  No erythema or edema.  No skin changes of the toes or webspaces.  Proximal foot and ankle nontender  Skin:    General: Skin is warm.     Capillary Refill: Capillary refill takes less than 2 seconds.     Findings: No erythema or rash.  Neurological:     General: No focal deficit present.     Mental Status: She is alert.     Sensory: No sensory deficit.     Motor: No weakness.     ED Results / Procedures / Treatments   Labs (all labs ordered are listed, but only abnormal results are displayed) Labs Reviewed - No data to  display  EKG None  Radiology DG Foot Complete Right  Result Date: 07/08/2022 CLINICAL DATA:  Right foot pain EXAM: RIGHT FOOT COMPLETE - 3+ VIEW COMPARISON:  06/24/2007 FINDINGS: There is no evidence of fracture or dislocation. Moderate osteoarthritis of the first MTP joint, progressed from prior. Anterior osteophyte formation at the tibiotalar joint. Remaining joint spaces are relatively well preserved. Small plantar calcaneal spur. Soft tissues are unremarkable. IMPRESSION: Moderate osteoarthritis of the first MTP joint, progressed from prior. Electronically Signed   By: Duanne Guess D.O.   On: 07/08/2022 10:32    Procedures Procedures    Medications Ordered in ED Medications - No data to display  ED Course/ Medical Decision Making/ A&P                           Medical Decision Making Patient here requesting evaluation of bilateral foot pain times several months.  No known injury.  Concerned she may have a stress fracture of her foot.  Right foot pain greater than left.  On exam, patient well-appearing.  Mildly hypertensive but vitals otherwise reassuring.  She has some tenderness to the distal foot near the base of the toes.  No skin changes, erythema or edema.  Pain on range of motion of the toes.  Neurovascularly intact.  Low clinical suspicion for emergent process.  Will obtain x-ray of the foot I suspect she will be appropriate for discharge home and will have her follow-up with podiatry.  Amount and/or Complexity of Data Reviewed Radiology: ordered.    Details: X-ray of the right foot obtained, shows moderate OA of the first MTP joint Discussion of management or test interpretation with external provider(s): Discussed x-ray findings and need for supportive shoes while standing or walking.  She will continue NSAID therapy.  Follow-up information provided for local podiatry.  She appears appropriate for discharge home all questions were answered  Risk Prescription drug  management.           Final Clinical Impression(s) / ED Diagnoses Final diagnoses:  Bilateral foot pain    Rx / DC Orders ED Discharge Orders          Ordered    traMADol (ULTRAM) 50 MG tablet  Every 6 hours PRN        07/08/22 1046              Pauline Aus, PA-C 07/09/22 1017    Jacalyn Lefevre, MD 07/09/22 1102

## 2022-08-07 DIAGNOSIS — Z1231 Encounter for screening mammogram for malignant neoplasm of breast: Secondary | ICD-10-CM | POA: Diagnosis not present

## 2022-08-07 DIAGNOSIS — M79671 Pain in right foot: Secondary | ICD-10-CM | POA: Diagnosis not present

## 2022-08-14 DIAGNOSIS — M79671 Pain in right foot: Secondary | ICD-10-CM | POA: Diagnosis not present

## 2022-09-16 DIAGNOSIS — M79671 Pain in right foot: Secondary | ICD-10-CM | POA: Diagnosis not present

## 2022-09-18 ENCOUNTER — Other Ambulatory Visit: Payer: Self-pay | Admitting: Orthopaedic Surgery

## 2022-09-18 DIAGNOSIS — M79671 Pain in right foot: Secondary | ICD-10-CM

## 2022-09-25 ENCOUNTER — Ambulatory Visit
Admission: RE | Admit: 2022-09-25 | Discharge: 2022-09-25 | Disposition: A | Discharge status: Home / Self Care | Payer: BC Managed Care – PPO | Source: Ambulatory Visit | Attending: Orthopaedic Surgery | Admitting: Orthopaedic Surgery

## 2022-09-25 DIAGNOSIS — M79671 Pain in right foot: Secondary | ICD-10-CM

## 2022-09-25 DIAGNOSIS — M19071 Primary osteoarthritis, right ankle and foot: Secondary | ICD-10-CM | POA: Diagnosis not present

## 2022-09-25 DIAGNOSIS — R609 Edema, unspecified: Secondary | ICD-10-CM | POA: Diagnosis not present

## 2022-09-30 DIAGNOSIS — M79671 Pain in right foot: Secondary | ICD-10-CM | POA: Diagnosis not present

## 2022-10-21 DIAGNOSIS — M79671 Pain in right foot: Secondary | ICD-10-CM | POA: Diagnosis not present

## 2022-10-21 DIAGNOSIS — B351 Tinea unguium: Secondary | ICD-10-CM | POA: Diagnosis not present

## 2022-10-21 DIAGNOSIS — M79672 Pain in left foot: Secondary | ICD-10-CM | POA: Diagnosis not present

## 2022-11-13 DIAGNOSIS — F331 Major depressive disorder, recurrent, moderate: Secondary | ICD-10-CM | POA: Diagnosis not present

## 2022-11-13 DIAGNOSIS — M791 Myalgia, unspecified site: Secondary | ICD-10-CM | POA: Diagnosis not present

## 2022-11-13 DIAGNOSIS — R296 Repeated falls: Secondary | ICD-10-CM | POA: Diagnosis not present

## 2022-11-13 DIAGNOSIS — M1991 Primary osteoarthritis, unspecified site: Secondary | ICD-10-CM | POA: Diagnosis not present

## 2023-01-20 ENCOUNTER — Other Ambulatory Visit (HOSPITAL_COMMUNITY): Payer: Self-pay | Admitting: Surgery

## 2023-01-22 ENCOUNTER — Other Ambulatory Visit (HOSPITAL_COMMUNITY): Payer: Self-pay | Admitting: Adult Health Nurse Practitioner

## 2023-01-22 DIAGNOSIS — Z1231 Encounter for screening mammogram for malignant neoplasm of breast: Secondary | ICD-10-CM

## 2023-01-27 ENCOUNTER — Encounter: Payer: Self-pay | Admitting: Dietician

## 2023-01-27 ENCOUNTER — Encounter: Payer: BC Managed Care – PPO | Attending: Surgery | Admitting: Dietician

## 2023-01-27 ENCOUNTER — Ambulatory Visit: Payer: Self-pay | Admitting: Surgery

## 2023-01-27 ENCOUNTER — Ambulatory Visit (HOSPITAL_COMMUNITY)
Admission: RE | Admit: 2023-01-27 | Discharge: 2023-01-27 | Disposition: A | Discharge status: Home / Self Care | Payer: BC Managed Care – PPO | Source: Ambulatory Visit | Attending: Surgery | Admitting: Surgery

## 2023-01-27 VITALS — Ht 68.0 in | Wt 317.8 lb

## 2023-01-27 DIAGNOSIS — E669 Obesity, unspecified: Secondary | ICD-10-CM | POA: Insufficient documentation

## 2023-01-27 DIAGNOSIS — Z6841 Body Mass Index (BMI) 40.0 and over, adult: Secondary | ICD-10-CM | POA: Diagnosis present

## 2023-01-27 NOTE — Progress Notes (Addendum)
Nutrition Assessment for Bariatric Surgery Medical Nutrition Therapy Appt Start Time: 7:50    End Time: 8:51  Patient was seen on 01/27/2023 for Pre-Operative Nutrition Assessment.  Referral stated Supervised Weight Loss (SWL) visits needed: 0  Not cleared at this time:  Pt to follow up for minimum of one more visit to assist pt with progressing through stages of change/further nutrition education. RD advised pt that this follow up visit is not mandated through insurance. Pt verbalized agreement.  Planned surgery: Sleeve Gastrectomy  Pt expectation of surgery: wt loss goal, to lose 100 lbs   NUTRITION ASSESSMENT   Anthropometrics  Start weight at [facility]: 317.8 lbs (date: 01/27/2023)  Height: 68 in BMI: 48.32 kg/m2     Clinical  Medical hx: Asthma, COPD, obesity, fibromyalgia Medications: diclofenac, duloxetine HCL, magnesium oxide, gabapentin, vit d, bit b12  Labs: LDL 117; Vit D 12.1 Notable signs/symptoms: none noted Any previous deficiencies? No  Micronutrient Nutrition Focused Physical Exam: Hair: No issues observed Eyes: No issues observed Mouth: No issues observed Neck: No issues observed Nails: No issues observed Skin: No issues observed  Lifestyle & Dietary Hx  Pt arrived with her husband. Pt states she lives with her husband.  Pt states she works at [employer].  Pharmacotherapy: History of weight loss medication used: phentermine   Current Physical Activity Recommendations state 150 minutes per week of moderate to vigorous movement including Cardio and 1-2 days of resistance activities as well as flexibility/balance activities:   Pts current physical activity: 0% recommendation reached   Sleep Hygiene: duration and quality: 5 hours a night  Current Patient Perceived Stress Level as stated by pt on a scale of 1-10:  3      Stress Management Techniques: support system  Recommendation: 1.5-2 cups fruits per day,  2-3 cups vegetables per day; at least half all grains  whole  Fruit servings per week: 3-4 Non starchy vegetable servings per week: 2-3 Whole Grains per week: 0  24-Hr Dietary Recall First Meal: skip (up at 5) Snack: breakfast sandwich (9 am) Second Meal:  Snack: fruit Third Meal: 3:30-4 pm chicken or pork chops or ground [place], rice or noodles, broccoli Snack: ice cream Beverages: Bai, water, Dr. Malachi Bonds zero  Alcoholic beverages per week: 1 drink every few months   Estimated Energy Needs Calories: 1500  NUTRITION DIAGNOSIS  Overweight/obesity ([place]-3.3) related to past poor dietary habits and physical inactivity as evidenced by patient w/ planned sleeve surgery following dietary guidelines for continued weight loss.   NUTRITION INTERVENTION  Nutrition counseling (C-1) and education (E-2) to facilitate bariatric surgery goals.  Educated pt on micronutrient deficiencies post surgery and behavioral/dietary strategies to mitigate that risk    Behavioral Intervention  Pre-Op Goals Reviewed with the Patient Healthy Eating Behaviors Switch to non-caloric, non-carbonated and non-caffeinated beverages such as       water, unsweetened tea, Crystal Light and zero calorie beverages (aim for 64 oz. per day) Cut out grazing between meals or at night  Find a protein shake you like Eat every 3-5 hours        Eliminate distractions while eating (TV, computer, reading, driving, texting) Take 20-30 minutes to eat a meal  Decrease high sugar foods/decrease high fat/fried foods Eliminate alcoholic beverages Increase protein intake (eggs, fish, chicken, yogurt) before surgery Eat non starchy vegetables 2 times a day 7 days a week Eat complex carbohydrates such as whole grains and fruits    Physical Activity Increase my usual daily activity (use stairs,  park farther, etc.) Engage in __walking or video___  activity  _20-30__ minutes ___daily___ times per week  Other:    _________________________________________________________________   Problem       Solving I will think about my usual eating patterns and how to tweak them How can my friends and family support me Barriers to starting my changes Learn and understand appetite verses hunger   Healthy Coping Allow for ___________ activities per week to help me manage stress Reframe negative thoughts I will keep a picture of someone or something that is my inspiration & look at it daily   Monitoring  Weigh myself once a week  Measure my progress by monitoring how my clothes fit Keep a food record of what I eat and drink for the next ________ (time period) Take pictures of what I eat and drink for the next ________ (time period) Use an app to count steps/day for the next_______ (time period) Measure my progress such as increased energy and more restful sleep Monitor your acid reflux and bowel habits, are they getting better?    Handouts Provided Include  Bariatric Surgery handouts (Nutrition Visits, Pre Surgery Behavioral Change Goals, Protein Shakes Brands to Choose From, Vitamins & Mineral Supplementation)  Learning Style & Readiness for Change Teaching method utilized: Visual & Auditory  Demonstrated degree of understanding via: Teach Back  Readiness Level: Preparation Barriers to learning/adherence to lifestyle change: nothing identified  RD's Notes for Next Visit Patient progress toward chosen goals     MONITORING & EVALUATION Dietary intake, weekly physical activity, body weight, and pre-op behavioral change goals reached at next nutrition visit.    Next Steps  Patient is to follow up at [facility] for further nutrition education.

## 2023-02-03 ENCOUNTER — Encounter (HOSPITAL_COMMUNITY): Payer: Self-pay | Admitting: Surgery

## 2023-02-09 ENCOUNTER — Encounter (HOSPITAL_COMMUNITY): Payer: Self-pay | Admitting: Surgery

## 2023-02-09 NOTE — Anesthesia Preprocedure Evaluation (Signed)
Anesthesia Evaluation  Patient identified by MRN, date of birth, ID band Patient awake    Reviewed: Allergy & Precautions, NPO status , Patient's Chart, lab work & pertinent test results  History of Anesthesia Complications Negative for: history of anesthetic complications  Airway Mallampati: II  TM Distance: >3 FB Neck ROM: Full    Dental  (+) Edentulous Lower, Edentulous Upper   Pulmonary COPD   Pulmonary exam normal        Cardiovascular negative cardio ROS Normal cardiovascular exam     Neuro/Psych  Headaches PSYCHIATRIC DISORDERS Anxiety Depression       GI/Hepatic Neg liver ROS,GERD  Controlled,,  Endo/Other    Morbid obesity  Renal/GU negative Renal ROS     Musculoskeletal  (+) Arthritis ,    Abdominal  (+) + obese  Peds  Hematology negative hematology ROS (+)   Anesthesia Other Findings   Reproductive/Obstetrics                             Anesthesia Physical Anesthesia Plan  ASA: 3  Anesthesia Plan: MAC   Post-op Pain Management:    Induction:   PONV Risk Score and Plan: 2 and Propofol infusion and Treatment may vary due to age or medical condition  Airway Management Planned: Nasal Cannula and Natural Airway  Additional Equipment: None  Intra-op Plan:   Post-operative Plan:   Informed Consent: I have reviewed the patients History and Physical, chart, labs and discussed the procedure including the risks, benefits and alternatives for the proposed anesthesia with the patient or authorized representative who has indicated his/her understanding and acceptance.       Plan Discussed with: CRNA and Anesthesiologist  Anesthesia Plan Comments:         Anesthesia Quick Evaluation

## 2023-02-10 ENCOUNTER — Ambulatory Visit: Payer: BC Managed Care – PPO | Admitting: Dietician

## 2023-02-10 ENCOUNTER — Ambulatory Visit (HOSPITAL_COMMUNITY): Payer: BC Managed Care – PPO | Admitting: Anesthesiology

## 2023-02-10 ENCOUNTER — Other Ambulatory Visit: Payer: Self-pay

## 2023-02-10 ENCOUNTER — Encounter (HOSPITAL_COMMUNITY): Payer: Self-pay | Admitting: Surgery

## 2023-02-10 ENCOUNTER — Ambulatory Visit (HOSPITAL_COMMUNITY)
Admission: RE | Admit: 2023-02-10 | Discharge: 2023-02-10 | Disposition: A | Discharge status: Home / Self Care | Payer: BC Managed Care – PPO | Source: Ambulatory Visit | Attending: Surgery | Admitting: Surgery

## 2023-02-10 ENCOUNTER — Encounter (HOSPITAL_COMMUNITY)
Admission: RE | Disposition: A | Discharge status: Home / Self Care | Payer: Self-pay | Source: Ambulatory Visit | Attending: Surgery

## 2023-02-10 DIAGNOSIS — K319 Disease of stomach and duodenum, unspecified: Secondary | ICD-10-CM | POA: Insufficient documentation

## 2023-02-10 DIAGNOSIS — K2970 Gastritis, unspecified, without bleeding: Secondary | ICD-10-CM | POA: Diagnosis not present

## 2023-02-10 DIAGNOSIS — K449 Diaphragmatic hernia without obstruction or gangrene: Secondary | ICD-10-CM | POA: Diagnosis present

## 2023-02-10 HISTORY — PX: ESOPHAGOGASTRODUODENOSCOPY: SHX5428

## 2023-02-10 HISTORY — PX: BIOPSY: SHX5522

## 2023-02-10 SURGERY — EGD (ESOPHAGOGASTRODUODENOSCOPY)
Anesthesia: Monitor Anesthesia Care

## 2023-02-10 MED ORDER — PROPOFOL 500 MG/50ML IV EMUL
INTRAVENOUS | Status: DC | PRN
  Administered 2023-02-10: 125 ug/kg/min via INTRAVENOUS

## 2023-02-10 MED ORDER — LIDOCAINE HCL (CARDIAC) PF 100 MG/5ML IV SOSY
PREFILLED_SYRINGE | INTRAVENOUS | Status: DC | PRN
  Administered 2023-02-10: 80 mg via INTRAVENOUS

## 2023-02-10 MED ORDER — PROPOFOL 500 MG/50ML IV EMUL
INTRAVENOUS | Status: AC
  Filled 2023-02-10: qty 100

## 2023-02-10 MED ORDER — PROPOFOL 10 MG/ML IV BOLUS
INTRAVENOUS | Status: AC
  Filled 2023-02-10: qty 20

## 2023-02-10 MED ORDER — PANTOPRAZOLE SODIUM 40 MG PO TBEC: 40.0000 mg | DELAYED_RELEASE_TABLET | Freq: Every day | ORAL | 3 refills | Status: DC

## 2023-02-10 MED ORDER — PROPOFOL 10 MG/ML IV BOLUS
INTRAVENOUS | Status: DC | PRN
  Administered 2023-02-10 (×2): 40 mg via INTRAVENOUS

## 2023-02-10 MED ORDER — LACTATED RINGERS IV SOLN
INTRAVENOUS | Status: DC
  Administered 2023-02-10: 1000 mL via INTRAVENOUS

## 2023-02-10 NOTE — Anesthesia Postprocedure Evaluation (Signed)
Anesthesia Post Note  Patient: Heather Mckinney  Procedure(s) Performed: ESOPHAGOGASTRODUODENOSCOPY (EGD) BIOPSY     Patient location during evaluation: PACU Anesthesia Type: MAC Level of consciousness: awake and alert Pain management: pain level controlled Vital Signs Assessment: post-procedure vital signs reviewed and stable Respiratory status: spontaneous breathing, nonlabored ventilation and respiratory function stable Cardiovascular status: stable and blood pressure returned to baseline Anesthetic complications: no   No notable events documented.  Last Vitals:  Vitals:   02/10/23 0800 02/10/23 0802  BP:  118/69  Pulse: 65 (!) 59  Resp: 17 18  Temp:    SpO2: 98% 98%    Last Pain:  Vitals:   02/10/23 0802  TempSrc:   PainSc: 0-No pain                 Audry Pili

## 2023-02-10 NOTE — Op Note (Signed)
[facility] Patient Name: Heather Mckinney Procedure Date: 02/10/2023 MRN: [id number] Attending MD: Felicie Morn , ,  Date of Birth: [birth year] CSN: [id number] Age: 52 Admit Type: Outpatient Procedure:                Upper GI endoscopy Indications:               Providers:                Felicie Morn, Darliss Cheney, Technician, Arnoldo Hooker, CRNA, Elmer Ramp. Tilden Dome, RN Referring MD:              Medicines:                Monitored Anesthesia Care Complications:            No immediate complications. Estimated blood loss:                            Minimal. Estimated Blood Loss:     Estimated blood loss was minimal. Procedure:                Pre-Anesthesia Assessment:                           - Prior to the procedure, a History and Physical                            was performed, and patient medications and                            allergies were reviewed. The patient is competent.                            The risks and benefits of the procedure and the                            sedation options and risks were discussed with the                            patient. All questions were answered and informed                            consent was obtained. Patient identification and                            proposed procedure were verified by the physician                            in the pre-procedure area. Mental Status                            Examination: alert and oriented. Airway                            Examination: normal  oropharyngeal airway and neck                            mobility. Respiratory Examination: clear to                            auscultation. CV Examination: normal. ASA Grade                            Assessment: II - A patient with mild systemic                            disease. After reviewing the risks and benefits,                            the patient was deemed in satisfactory condition to                             undergo the procedure. The anesthesia plan was to                            use monitored anesthesia care (MAC). Immediately                            prior to administration of medications, the patient                            was re-assessed for adequacy to receive sedatives.                            The heart rate, respiratory rate, oxygen                            saturations, blood pressure, adequacy of pulmonary                            ventilation, and response to care were monitored                            throughout the procedure. The physical status of                            the patient was re-assessed after the procedure.                           After obtaining informed consent, the endoscope was                            passed under direct vision. Throughout the                            procedure, the patient's blood pressure, pulse, and  oxygen saturations were monitored continuously. The                            [id number] Olympus endoscope was introduced                            through the mouth, and advanced to the second part                            of duodenum. The upper GI endoscopy was                            accomplished without difficulty. The patient                            tolerated the procedure well. Scope In: Scope Out: Findings:      A medium-sized hiatal hernia was present.      Diffuse mild inflammation was found in the stomach.      The exam of the stomach was otherwise normal.      The in the duodenum was normal.      The gastric antrum was normal. Biopsies were taken with a cold forceps       for Helicobacter pylori testing. Verification of patient identification       for the specimen was done. Estimated blood loss was minimal. Impression:               - Medium-sized hiatal hernia.                           - Gastritis.                           -  Normal.                           - Normal antrum. Biopsied. Moderate Sedation:      Moderate (conscious) sedation was personally administered by an       anesthesia professional. The following parameters were monitored: oxygen       saturation, heart rate, blood pressure, and response to care. Total       physician intraservice time was 15 minutes. Recommendation:           - Discharge patient to home.                           - Resume previous diet.                           - Use Protonix (pantoprazole) 40 mg PO daily for 3                            months. Procedure Code(s):        --- Professional ---                           [phone], Esophagogastroduodenoscopy, flexible,  transoral; with biopsy, single or multiple Diagnosis Code(s):        --- Professional ---                           K44.9, Diaphragmatic hernia without obstruction or                            gangrene                           K29.70, Gastritis, unspecified, without bleeding CPT copyright 2022 [employer] Association. All rights reserved. The codes documented in this report are preliminary and upon coder review may  be revised to meet current compliance requirements. [place],  02/10/2023 7:52:11 AM This report has been signed electronically. Number of Addenda: 0

## 2023-02-10 NOTE — Discharge Instructions (Signed)
YOU HAD AN ENDOSCOPIC PROCEDURE TODAY: Refer to the procedure report and other information in the discharge instructions given to you for any specific questions about what was found during the examination. If this information does not answer your questions, please call [facility] office at [phone] to clarify.   YOU SHOULD EXPECT: Some feelings of bloating in the abdomen. Passage of more gas than usual. Walking can help get rid of the air that was put into your GI tract during the procedure and reduce the bloating. If you had a lower endoscopy (such as a colonoscopy or flexible sigmoidoscopy) you may notice spotting of blood in your stool or on the toilet paper. Some abdominal soreness may be present for a day or two, also.  DIET: Your first meal following the procedure should be a light meal and then it is ok to progress to your normal diet. A half-sandwich or bowl of soup is an example of a good first meal. Heavy or fried foods are harder to digest and may make you feel nauseous or bloated. Drink plenty of fluids but you should avoid alcoholic beverages for 24 hours. If you had a esophageal dilation, please see attached instructions for diet.    ACTIVITY: Your care partner should take you home directly after the procedure. You should plan to take it easy, moving slowly for the rest of the day. You can resume normal activity the day after the procedure however YOU SHOULD NOT DRIVE, use power tools, machinery or perform tasks that involve climbing or major physical exertion for 24 hours (because of the sedation medicines used during the test).   SYMPTOMS TO REPORT IMMEDIATELY: A general surgeon can be reached at any hour. Please call [phone]  for any of the following symptoms:  Following lower endoscopy (colonoscopy, flexible sigmoidoscopy) Excessive amounts of blood in the stool  Significant tenderness, worsening of abdominal pains  Swelling of the abdomen that is new, acute   Fever of 100 or higher  Following upper endoscopy (EGD) Vomiting of blood or coffee ground material  New, significant abdominal pain  New, significant chest pain or pain under the shoulder blades  Painful or persistently difficult swallowing  New shortness of breath  Black, tarry-looking or red, bloody stools  FOLLOW UP:  If any biopsies were taken you will be contacted by phone or by letter within the next 1-3 weeks. Call [phone]  if you have not heard about the biopsies in 3 weeks.  Please also call with any specific questions about appointments or follow up tests.

## 2023-02-10 NOTE — Transfer of Care (Signed)
Immediate Anesthesia Transfer of Care Note  Patient: Heather Mckinney  Procedure(s) Performed: Procedure(s): ESOPHAGOGASTRODUODENOSCOPY (EGD) (N/A) BIOPSY  Patient Location: PACU  Anesthesia Type:MAC  Level of Consciousness:  sedated, patient cooperative and responds to stimulation  Airway & Oxygen Therapy:Patient Spontanous Breathing and Patient connected to face mask oxgen  Post-op Assessment:  Report given to PACU RN and Post -op Vital signs reviewed and stable  Post vital signs:  Reviewed and stable  Last Vitals:  Vitals:   02/10/23 0631  Pulse: 75  Resp: 17  Temp: 36.6 C  SpO2: [id number]    Complications: No apparent anesthesia complications

## 2023-02-10 NOTE — H&P (Signed)
Admitting Physician: Nickola Major Pati Thinnes  Service: Bariatric surgery   CC: Preoperative EGD  Subjective   HPI: Heather Mckinney is an 52 y.o. female who is here for preoperative EGD  Past Medical History:  Diagnosis Date   Anxiety    Arthritis    COPD (chronic obstructive pulmonary disease) (HCC)    Depression    Migraine     Past Surgical History:  Procedure Laterality Date   CESAREAN SECTION     CHOLECYSTECTOMY      Family History  Problem Relation Age of Onset   Diabetes Mother    Hypertension Mother    Asthma Mother    Cerebral aneurysm Mother    Heart Problems Mother    Migraines Mother    Migraines Daughter    Vision loss Daughter    Hydrocephalus Son     Social:  reports that she has never smoked. She has been exposed to tobacco smoke. She has never used smokeless tobacco. She reports current alcohol use. She reports that she does not use drugs.  Allergies:  Allergies  Allergen Reactions   Ephedrine Other (See Comments)    Patient states she went into cardiac arrest after taking sudafed.   Aspirin Other (See Comments)    Hearing loss     Medications: Current Outpatient Medications  Medication Instructions   AJOVY 225 MG/1.5ML SOSY INJECT 225 MG INTO THE SKIN EVERY 30 DAYS   Ajovy 225 mg, Subcutaneous, Every 30 days   albuterol (PROVENTIL HFA;VENTOLIN HFA) 108 (90 Base) MCG/ACT inhaler 1-2 puffs, Inhalation, Every 6 hours PRN   B-12 2,500 mcg, Oral, Daily   Dialyvite Vitamin D 5000 5,000 Units, Oral, Daily   diclofenac (VOLTAREN) 75 mg, Oral, 2 times daily   DULoxetine (CYMBALTA) 20 mg, Oral, Daily   gabapentin (NEURONTIN) 300 mg, Oral, 2 times daily   magnesium oxide (MAG-OX) 400 mg, Oral, Daily at bedtime   naproxen sodium (ALEVE) 440 mg, Oral, Daily PRN   nortriptyline (PAMELOR) 10 mg, Oral, Daily at bedtime, Appointment needed for further refills. Call [phone] to schedule.   traMADol (ULTRAM) 50 mg, Oral, Every 6 hours PRN   UBRELVY  100 MG TABS TAKE 1 TABLET BY MOUTH EVERY 2 HOURS AS NEEDED (MAX OF 200 MG A DAY)    ROS - all of the below systems have been reviewed with the patient and positives are indicated with bold text General: chills, fever or night sweats Eyes: blurry vision or double vision ENT: epistaxis or sore throat Allergy/Immunology: itchy/watery eyes or nasal congestion Hematologic/Lymphatic: bleeding problems, blood clots or swollen lymph nodes Endocrine: temperature intolerance or unexpected weight changes Breast: new or changing breast lumps or nipple discharge Resp: cough, shortness of breath, or wheezing CV: chest pain or dyspnea on exertion GI: as per HPI GU: dysuria, trouble voiding, or hematuria MSK: joint pain or joint stiffness Neuro: TIA or stroke symptoms Derm: pruritus and skin lesion changes Psych: anxiety and depression  Objective   PE Pulse 75, temperature 97.8 F (36.6 C), temperature source Tympanic, resp. rate 17, height 5' 8"$  (1.727 m), weight (!) 144.2 kg, last menstrual period 12/25/2019, SpO2 99 %. Constitutional: NAD; conversant; no deformities Eyes: Moist conjunctiva; no lid lag; anicteric; PERRL Neck: Trachea midline; no thyromegaly Lungs: Normal respiratory effort; no tactile fremitus CV: RRR; no palpable thrills; no pitting edema GI: Abd Soft, nontender; no palpable hepatosplenomegaly MSK: Normal range of motion of extremities; no clubbing/cyanosis Psychiatric: Appropriate affect; alert and oriented x3 Lymphatic: No palpable  cervical or axillary lymphadenopathy  No results found for this or any previous visit (from the past 24 hour(s)).  Imaging Orders  No imaging studies ordered today     Assessment and Plan   Heather Mckinney is an 52 y.o. female with obesity here for EGD prior to bariatric surgery.  I explained my rational for performing upper endoscopy in my bariatric patients. During the procedure I will biopsy for H. Pylori, evaluate for hiatal hernia,  evaluate for reflux esophagitis and look for any other abnormalities that may influence the procedure. We discussed the risks, benefits and alternative to this procedure and the patient granted consent to proceed.   Felicie Morn, MD  [facility] Use [email or web address] to contact on call provider

## 2023-02-11 LAB — SURGICAL PATHOLOGY

## 2023-02-12 ENCOUNTER — Encounter (HOSPITAL_COMMUNITY): Payer: Self-pay | Admitting: Surgery

## 2023-02-17 ENCOUNTER — Encounter: Payer: Self-pay | Admitting: Dietician

## 2023-02-17 ENCOUNTER — Encounter: Payer: BC Managed Care – PPO | Attending: Surgery | Admitting: Dietician

## 2023-02-17 VITALS — Ht 68.0 in | Wt 311.6 lb

## 2023-02-17 DIAGNOSIS — E669 Obesity, unspecified: Secondary | ICD-10-CM | POA: Diagnosis present

## 2023-02-17 NOTE — Progress Notes (Signed)
Supervised Weight Loss Visit Bariatric Nutrition Education Appt Start Time: 7:42    End Time: 8:03  Referral stated Supervised Weight Loss (SWL) visits needed: 0  Pt completed visits.   Pt has cleared nutrition requirements.   Planned surgery: Sleeve Gastrectomy  Pt expectation of surgery: wt loss goal, to lose 100 lbs   NUTRITION ASSESSMENT   Anthropometrics  Start weight at [facility]: 317.8 lbs (date: 01/27/2023)  Height: 68 in Weight today: 311.6 lbs BMI: 47.38 kg/m2     Clinical  Medical hx: Asthma, COPD, obesity, fibromyalgia Medications: diclofenac, duloxetine HCL, magnesium oxide, gabapentin, vit d, bit b12, Protonix Labs: LDL 117; Vit D 12.1 Notable signs/symptoms: none noted Any previous deficiencies? No  Lifestyle & Dietary Hx  Pt states she has only had one soda since last visit. Pt states she has purchased sectioned food plates/storeage containers for meal prepping. Pt states she is training herself to eat smaller portions. Pt states she has been eating at least 3 meals a day, avoiding skipping breakfast. Pt is doing well and making changes toward goals.  Estimated daily fluid intake: 64+ oz Supplements: multivitamin Current average weekly physical activity: knee friendly workout video, daily, 30 minutes; two times a week, 10 sit ups.  24-Hr Dietary Recall First Meal: protein shake, fruit (4:30) Snack: yogurt, cheese stick(9 am) Second Meal: tuna fish, triscuits, cashews, fruit or cucumbers Snack: fruit Third Meal: 3:30-4 pm chicken or pork chops or ground [place], rice or noodles, broccoli Snack: triscuits Beverages: water, water with crystal light  Estimated Energy Needs Calories: 1500   NUTRITION DIAGNOSIS  Overweight/obesity ([place]-3.3) related to past poor dietary habits and physical inactivity as evidenced by patient w/ planned sleeve surgery following dietary guidelines for continued weight loss.   NUTRITION INTERVENTION  Nutrition counseling (C-1)  and education (E-2) to facilitate bariatric surgery goals.   Behavioral Intervention  Pre-Op Goals Reviewed with the Patient Healthy Eating Behaviors Switch to non-caloric, non-carbonated and non-caffeinated beverages such as       water, unsweetened tea, Crystal Light and zero calorie beverages (aim for 64 oz. per day) Cut out grazing between meals or at night  Find a protein shake you like Eat every 3-5 hours        Eliminate distractions while eating (TV, computer, reading, driving, texting) Take 20-30 minutes to eat a meal  Decrease high sugar foods/decrease high fat/fried foods Eliminate alcoholic beverages Increase protein intake (eggs, fish, chicken, yogurt) before surgery Eat non starchy vegetables 2 times a day 7 days a week Eat complex carbohydrates such as whole grains and fruits    Physical Activity Increase my usual daily activity (use stairs, park farther, etc.) Engage in __walking or video___  activity  _20-30__ minutes ___daily___ times per week  Other:    _________________________________________________________________   Problem      Solving I will think about my usual eating patterns and how to tweak them How can my friends and family support me Barriers to starting my changes Learn and understand appetite verses hunger   Healthy Coping Allow for ___________ activities per week to help me manage stress Reframe negative thoughts I will keep a picture of someone or something that is my inspiration & look at it daily   Monitoring  Weigh myself once a week  Measure my progress by monitoring how my clothes fit Keep a food record of what I eat and drink for the next ________ (time period) Take pictures of what I eat and drink for the next  ________ (time period) Use an app to count steps/day for the next_______ (time period) Measure my progress such as increased energy and more restful sleep Monitor your acid reflux and bowel habits, are they getting better?    Handouts Provided Include   Learning Style & Readiness for Change Teaching method utilized: Visual & Auditory  Demonstrated degree of understanding via: Teach Back  Readiness Level: preparation Barriers to learning/adherence to lifestyle change: nothing identified  RD's Notes for next Visit   MONITORING & EVALUATION Dietary intake, weekly physical activity, body weight, and pre-op goals in 1 month.   Next Steps  Pt has completed visits. No further supervised visits required/recomended  Patient is to return to [facility] for pre-op class >2 weeks prior to surgery scheduled date.

## 2023-02-27 ENCOUNTER — Encounter: Payer: Self-pay | Admitting: Radiology

## 2023-03-24 ENCOUNTER — Ambulatory Visit (HOSPITAL_COMMUNITY): Payer: BC Managed Care – PPO

## 2023-03-24 NOTE — Progress Notes (Signed)
Surgery orders requested via Epic inbox. °

## 2023-03-26 ENCOUNTER — Ambulatory Visit: Payer: Self-pay | Admitting: Surgery

## 2023-03-27 NOTE — Patient Instructions (Addendum)
DUE TO COVID-19 ONLY TWO VISITORS  (aged 53 and older)  ARE ALLOWED TO COME WITH YOU AND STAY IN THE WAITING ROOM ONLY DURING PRE OP AND PROCEDURE.   **NO VISITORS ARE ALLOWED IN THE SHORT STAY AREA OR RECOVERY ROOM!!**  IF YOU WILL BE ADMITTED INTO THE HOSPITAL YOU ARE ALLOWED ONLY FOUR SUPPORT PEOPLE DURING VISITATION HOURS ONLY (7 AM -8PM)   The support person(s) must pass our screening, gel in and out, and wear a mask at all times, including in the patient's room. Patients must also wear a mask when staff or their support person are in the room. Visitors GUEST BADGE MUST BE WORN VISIBLY  One adult visitor may remain with you overnight and MUST be in the room by 8 P.M.     Your procedure is scheduled on: 04/15/23   Report to [facility] Main Entrance    Report to admitting at : 5:15 AM   Call this number if you have problems the morning of surgery [phone]   MORNING OF SURGERY DRINK:   DRINK 1 G2 drink [place] ( 4:30 AM), DRINK ALL OF THE  G2 DRINK AT ONE TIME.   NO SOLID FOOD AFTER 600 PM THE NIGHT BEFORE YOUR SURGERY. YOU MAY DRINK CLEAR FLUIDS. THE G2 DRINK YOU DRINK BEFORE YOU LEAVE HOME WILL BE THE LAST FLUIDS YOU DRINK BEFORE SURGERY.  PAIN IS EXPECTED AFTER SURGERY AND WILL NOT BE COMPLETELY ELIMINATED. AMBULATION AND TYLENOL WILL HELP REDUCE INCISIONAL AND GAS PAIN. MOVEMENT IS KEY!  YOU ARE EXPECTED TO BE OUT OF BED WITHIN 4 HOURS OF ADMISSION TO YOUR PATIENT ROOM.  SITTING IN THE RECLINER THROUGHOUT THE DAY IS IMPORTANT FOR DRINKING FLUIDS AND MOVING GAS THROUGHOUT THE GI TRACT.  COMPRESSION STOCKINGS SHOULD BE WORN [place] UNLESS YOU ARE WALKING.   INCENTIVE SPIROMETER SHOULD BE USED EVERY HOUR WHILE AWAKE TO DECREASE POST-OPERATIVE COMPLICATIONS SUCH AS PNEUMONIA.  WHEN DISCHARGED HOME, IT IS IMPORTANT TO CONTINUE TO WALK EVERY HOUR AND USE THE INCENTIVE SPIROMETER EVERY HOUR.    After Midnight you may have the following  liquids until: 4:30 AM DAY OF SURGERY  Water Black Coffee (sugar ok, NO MILK/CREAM OR CREAMERS)  Tea (sugar ok, NO MILK/CREAM OR CREAMERS) regular and decaf                             Plain Jell-O (NO RED)                                           Fruit ices (not with fruit pulp, NO RED)                                     Popsicles (NO RED)                                                                  Juice: apple, WHITE grape, WHITE cranberry Sports drinks like Gatorade (NO RED)  Oral Hygiene is also important to reduce your risk of infection.                                    Remember - BRUSH YOUR TEETH THE MORNING OF SURGERY WITH YOUR REGULAR TOOTHPASTE  DENTURES WILL BE REMOVED PRIOR TO SURGERY PLEASE DO NOT APPLY "Poly grip" OR ADHESIVES!!!   Do NOT smoke after Midnight   Take these medicines the morning of surgery with A SIP OF WATER: gabapentin,duloxetine,pantoprazole.  Bring CPAP mask and tubing day of surgery.                              You may not have any metal on your body including hair pins, jewelry, and body piercing             Do not wear make-up, lotions, powders, perfumes/cologne, or deodorant  Do not wear nail polish including gel and S&S, artificial/acrylic nails, or any other type of covering on natural nails including finger and toenails. If you have artificial nails, gel coating, etc. that needs to be removed by a nail salon please have this removed prior to surgery or surgery may need to be canceled/ delayed if the surgeon/ anesthesia feels like they are unable to be safely monitored.   Do not shave  48 hours prior to surgery.    Do not bring valuables to the hospital. [place].   Contacts, glasses, or bridgework may not be worn into surgery.   Bring small overnight bag day of surgery.   DO NOT [place]. PHARMACY WILL DISPENSE MEDICATIONS LISTED ON  YOUR MEDICATION LIST TO YOU DURING YOUR ADMISSION [place]!    Patients discharged on the day of surgery will not be allowed to drive home.  Someone NEEDS to stay with you for the first 24 hours after anesthesia.   Special Instructions: Bring a copy of your healthcare power of attorney and living will documents         the day of surgery if you haven't scanned them before.              Please read over the following fact sheets you were given: IF YOU HAVE QUESTIONS ABOUT YOUR PRE-OP INSTRUCTIONS PLEASE CALL [phone]    [facility] - Preparing for Surgery Before surgery, you can play an important role.  Because skin is not sterile, your skin needs to be as free of germs as possible.  You can reduce the number of germs on your skin by washing with CHG (chlorahexidine gluconate) soap before surgery.  CHG is an antiseptic cleaner which kills germs and bonds with the skin to continue killing germs even after washing. Please DO NOT use if you have an allergy to CHG or antibacterial soaps.  If your skin becomes reddened/irritated stop using the CHG and inform your nurse when you arrive at Short Stay. Do not shave (including legs and underarms) for at least 48 hours prior to the first CHG shower.  You may shave your face/neck. Please follow these instructions carefully:  1.  Shower with CHG Soap the night before surgery and the  morning of Surgery.  2.  If you choose to wash your hair, wash  your hair first as usual with your  normal  shampoo.  3.  After you shampoo, rinse your hair and body thoroughly to remove the  shampoo.                           4.  Use CHG as you would any other liquid soap.  You can apply chg directly  to the skin and wash                       Gently with a scrungie or clean washcloth.  5.  Apply the CHG Soap to your body ONLY FROM THE NECK DOWN.   Do not use on face/ open                           Wound or open sores. Avoid contact with eyes, ears mouth and genitals  (private parts).                       Wash face,  Genitals (private parts) with your normal soap.             6.  Wash thoroughly, paying special attention to the area where your surgery  will be performed.  7.  Thoroughly rinse your body with warm water from the neck down.  8.  DO NOT shower/wash with your normal soap after using and rinsing off  the CHG Soap.                9.  Pat yourself dry with a clean towel.            10.  Wear clean pajamas.            11.  Place clean sheets on your bed the night of your first shower and do not  sleep with pets. Day of Surgery : Do not apply any lotions/deodorants the morning of surgery.  Please wear clean clothes to the hospital/surgery center.  FAILURE TO FOLLOW THESE INSTRUCTIONS MAY RESULT IN THE CANCELLATION OF YOUR SURGERY PATIENT SIGNATURE_________________________________  NURSE SIGNATURE__________________________________  ________________________________________________________________________

## 2023-03-31 ENCOUNTER — Encounter (HOSPITAL_COMMUNITY): Payer: Self-pay

## 2023-03-31 ENCOUNTER — Encounter: Payer: BC Managed Care – PPO | Attending: Surgery | Admitting: Skilled Nursing Facility1

## 2023-03-31 ENCOUNTER — Other Ambulatory Visit: Payer: Self-pay

## 2023-03-31 ENCOUNTER — Encounter (HOSPITAL_COMMUNITY)
Admission: RE | Admit: 2023-03-31 | Discharge: 2023-03-31 | Disposition: A | Discharge status: Home / Self Care | Payer: BC Managed Care – PPO | Source: Ambulatory Visit | Attending: Surgery | Admitting: Surgery

## 2023-03-31 ENCOUNTER — Encounter: Payer: Self-pay | Admitting: Skilled Nursing Facility1

## 2023-03-31 VITALS — BP 146/77 | HR 87 | Temp 99.6°F | Ht 68.0 in | Wt 311.0 lb

## 2023-03-31 VITALS — Wt 314.5 lb

## 2023-03-31 DIAGNOSIS — E669 Obesity, unspecified: Secondary | ICD-10-CM | POA: Diagnosis present

## 2023-03-31 DIAGNOSIS — Z01812 Encounter for preprocedural laboratory examination: Secondary | ICD-10-CM | POA: Diagnosis present

## 2023-03-31 DIAGNOSIS — Z6841 Body Mass Index (BMI) 40.0 and over, adult: Secondary | ICD-10-CM | POA: Diagnosis present

## 2023-03-31 DIAGNOSIS — Z01818 Encounter for other preprocedural examination: Secondary | ICD-10-CM

## 2023-03-31 HISTORY — DX: Unspecified asthma, uncomplicated: J45.909

## 2023-03-31 HISTORY — DX: Fibromyalgia: M79.7

## 2023-03-31 LAB — CBC
HCT: 41.2 % (ref 36.0–46.0)
Hemoglobin: 13.2 g/dL (ref 12.0–15.0)
MCH: 30 pg (ref 26.0–34.0)
MCHC: 32 g/dL (ref 30.0–36.0)
MCV: 93.6 fL (ref 80.0–100.0)
Platelets: 275 10*3/uL (ref 150–400)
RBC: 4.4 MIL/uL (ref 3.87–5.11)
RDW: 12.8 % (ref 11.5–15.5)
WBC: 8.4 10*3/uL (ref 4.0–10.5)
nRBC: 0 % (ref 0.0–0.2)

## 2023-03-31 NOTE — Progress Notes (Signed)
Pre-Operative Nutrition Class:    Patient was seen on 03/31/2023 for Pre-Operative Bariatric Surgery Education at the [facility].    Surgery date: 04/15/2023 Surgery type: RYGB Start weight at [facility]: 317.8 Weight today: 314.5  Samples given per MNT protocol. Patient educated on appropriate usage:  Ensure protein shake: June/12/2022; [id number] Procare multivitamin: 09/26; 9932 Procare calcium: 02/04/2025PK:[id number]   The following the learning objectives were met by the patient during this course: Identify Pre-Op Dietary Goals and will begin 2 weeks pre-operatively Identify appropriate sources of fluids and proteins  State protein recommendations and appropriate sources pre and post-operatively Identify Post-Operative Dietary Goals and will follow for 2 weeks post-operatively Identify appropriate multivitamin and calcium sources Describe the need for physical activity post-operatively and will follow MD recommendations State when to call healthcare provider regarding medication questions or post-operative complications When having a diagnosis of diabetes understanding hypoglycemia symptoms and the inclusion of 1 complex carbohydrate per meal  Handouts given during class include: Pre-Op Bariatric Surgery Diet Handout Protein Shake Handout Post-Op Bariatric Surgery Nutrition Handout BELT Program Information Flyer Support Group Information Flyer [facility] Bariatric Supplements Price List  Follow-Up Plan: Patient will follow-up at [facility] 2 weeks post operatively for diet advancement per MD.

## 2023-03-31 NOTE — Progress Notes (Signed)
For Short Stay: [place] appointment date:  Bowel Prep reminder:   For Anesthesia: PCP - Pablo Lawrence: NP Cardiologist - N/A  Chest x-ray -  EKG -  Stress Test -  ECHO -  Cardiac Cath -  Pacemaker/ICD device last checked: Pacemaker orders received: Device Rep notified:  Spinal Cord Stimulator:  Sleep Study - Yes CPAP - NO  Fasting Blood Sugar - N/A Checks Blood Sugar _____ times a day Date and result of last Hgb A1c-  Last dose of GLP1 agonist- N/A GLP1 instructions:   Last dose of SGLT-2 inhibitors-  SGLT-2 instructions:   Blood Thinner Instructions: Aspirin Instructions: Last Dose:  Activity level: Can go up a flight of stairs and activities of daily living without stopping and without chest pain and/or shortness of breath   Able to exercise without chest pain and/or shortness of breath  Anesthesia review: Hx: COPD  Patient denies shortness of breath, fever, cough and chest pain at PAT appointment   Patient verbalized understanding of instructions that were given to them at the PAT appointment. Patient was also instructed that they will need to review over the PAT instructions again at home before surgery.

## 2023-04-11 NOTE — Discharge Instructions (Signed)

## 2023-04-14 NOTE — Anesthesia Preprocedure Evaluation (Addendum)
Anesthesia Evaluation  Patient identified by MRN, date of birth, ID band Patient awake    Reviewed: Allergy & Precautions, NPO status , Patient's Chart, lab work & pertinent test results  Airway Mallampati: III  TM Distance: >3 FB Neck ROM: Full    Dental  (+) Edentulous Upper, Edentulous Lower, Dental Advisory Given   Pulmonary asthma , COPD,  COPD inhaler   Pulmonary exam normal breath sounds clear to auscultation       Cardiovascular negative cardio ROS Normal cardiovascular exam Rhythm:Regular Rate:Normal     Neuro/Psych  Headaches PSYCHIATRIC DISORDERS Anxiety Depression       GI/Hepatic negative GI ROS, Neg liver ROS,,,  Endo/Other    Morbid obesity (BMI 45)  Renal/GU negative Renal ROS  negative genitourinary   Musculoskeletal  (+) Arthritis ,  Fibromyalgia -  Abdominal   Peds  Hematology negative hematology ROS (+)   Anesthesia Other Findings   Reproductive/Obstetrics                             Anesthesia Physical Anesthesia Plan  ASA: 3  Anesthesia Plan: General   Post-op Pain Management: Tylenol PO (pre-op)*   Induction: Intravenous  PONV Risk Score and Plan: 3 and Midazolam, Dexamethasone and Ondansetron  Airway Management Planned: Oral ETT  Additional Equipment:   Intra-op Plan:   Post-operative Plan: Extubation in OR  Informed Consent: I have reviewed the patients History and Physical, chart, labs and discussed the procedure including the risks, benefits and alternatives for the proposed anesthesia with the patient or authorized representative who has indicated his/her understanding and acceptance.     Dental advisory given  Plan Discussed with: CRNA  Anesthesia Plan Comments: (2 IVs)       Anesthesia Quick Evaluation

## 2023-04-15 ENCOUNTER — Encounter (HOSPITAL_COMMUNITY)
Admission: RE | Disposition: A | Discharge status: Home / Self Care | Payer: Self-pay | Source: Ambulatory Visit | Attending: Surgery

## 2023-04-15 ENCOUNTER — Ambulatory Visit (HOSPITAL_COMMUNITY): Payer: BC Managed Care – PPO | Admitting: Anesthesiology

## 2023-04-15 ENCOUNTER — Other Ambulatory Visit: Payer: Self-pay

## 2023-04-15 ENCOUNTER — Observation Stay (HOSPITAL_COMMUNITY)
Admission: RE | Admit: 2023-04-15 | Discharge: 2023-04-17 | Disposition: A | Discharge status: Home / Self Care | Payer: BC Managed Care – PPO | Source: Ambulatory Visit | Attending: Surgery | Admitting: Surgery

## 2023-04-15 ENCOUNTER — Encounter (HOSPITAL_COMMUNITY): Payer: Self-pay | Admitting: Surgery

## 2023-04-15 DIAGNOSIS — Z7722 Contact with and (suspected) exposure to environmental tobacco smoke (acute) (chronic): Secondary | ICD-10-CM | POA: Insufficient documentation

## 2023-04-15 DIAGNOSIS — J449 Chronic obstructive pulmonary disease, unspecified: Secondary | ICD-10-CM | POA: Insufficient documentation

## 2023-04-15 DIAGNOSIS — K449 Diaphragmatic hernia without obstruction or gangrene: Secondary | ICD-10-CM | POA: Insufficient documentation

## 2023-04-15 DIAGNOSIS — Z6841 Body Mass Index (BMI) 40.0 and over, adult: Secondary | ICD-10-CM | POA: Insufficient documentation

## 2023-04-15 DIAGNOSIS — J45909 Unspecified asthma, uncomplicated: Secondary | ICD-10-CM | POA: Diagnosis not present

## 2023-04-15 DIAGNOSIS — Z79899 Other long term (current) drug therapy: Secondary | ICD-10-CM | POA: Insufficient documentation

## 2023-04-15 HISTORY — PX: HIATAL HERNIA REPAIR: SHX195

## 2023-04-15 LAB — CBC
HCT: 42 % (ref 36.0–46.0)
Hemoglobin: 13.4 g/dL (ref 12.0–15.0)
MCH: 30.2 pg (ref 26.0–34.0)
MCHC: 31.9 g/dL (ref 30.0–36.0)
MCV: 94.8 fL (ref 80.0–100.0)
Platelets: 281 10*3/uL (ref 150–400)
RBC: 4.43 MIL/uL (ref 3.87–5.11)
RDW: 12.4 % (ref 11.5–15.5)
WBC: 13.7 10*3/uL — ABNORMAL HIGH (ref 4.0–10.5)
nRBC: 0 % (ref 0.0–0.2)

## 2023-04-15 LAB — HEMOGLOBIN AND HEMATOCRIT, BLOOD
HCT: 40.6 % (ref 36.0–46.0)
Hemoglobin: 13.1 g/dL (ref 12.0–15.0)

## 2023-04-15 LAB — CREATININE, SERUM
Creatinine, Ser: 0.73 mg/dL (ref 0.44–1.00)
GFR, Estimated: >60 mL/min (ref >60)

## 2023-04-15 SURGERY — CREATION, GASTRIC BYPASS, ROUX-EN-Y, ROBOT-ASSISTED
Anesthesia: General

## 2023-04-15 MED ORDER — ACETAMINOPHEN 500 MG PO TABS: 1000.0000 mg | ORAL_TABLET | Freq: Once | ORAL | Status: DC

## 2023-04-15 MED ORDER — HEPARIN SODIUM (PORCINE) 5000 UNIT/ML IJ SOLN
5000.0000 [IU] | Freq: Once | INTRAMUSCULAR | Status: AC
  Administered 2023-04-15: 5000 [IU] via SUBCUTANEOUS
  Filled 2023-04-15: qty 1

## 2023-04-15 MED ORDER — CEFAZOLIN IN SODIUM CHLORIDE 3-0.9 GM/100ML-% IV SOLN
3.0000 g | INTRAVENOUS | Status: AC
  Administered 2023-04-15: 3 g via INTRAVENOUS
  Filled 2023-04-15: qty 100

## 2023-04-15 MED ORDER — FENTANYL CITRATE PF 50 MCG/ML IJ SOSY
25.0000 ug | PREFILLED_SYRINGE | INTRAMUSCULAR | Status: DC | PRN
  Administered 2023-04-15: 50 ug via INTRAVENOUS

## 2023-04-15 MED ORDER — LIDOCAINE 2% (20 MG/ML) 5 ML SYRINGE
INTRAMUSCULAR | Status: DC | PRN
  Administered 2023-04-15: 1.5 mg/kg/h via INTRAVENOUS
  Administered 2023-04-15: 100 mg via INTRAVENOUS

## 2023-04-15 MED ORDER — DEXAMETHASONE SODIUM PHOSPHATE 10 MG/ML IJ SOLN
INTRAMUSCULAR | Status: DC | PRN
  Administered 2023-04-15: 10 mg via INTRAVENOUS

## 2023-04-15 MED ORDER — BUPIVACAINE-EPINEPHRINE 0.25% -1:200000 IJ SOLN
INTRAMUSCULAR | Status: DC | PRN
  Administered 2023-04-15: 30 mL

## 2023-04-15 MED ORDER — SIMETHICONE 80 MG PO CHEW: 80.0000 mg | CHEWABLE_TABLET | Freq: Four times a day (QID) | ORAL | Status: DC | PRN

## 2023-04-15 MED ORDER — MORPHINE SULFATE (PF) 2 MG/ML IV SOLN: 1.0000 mg | INTRAVENOUS | Status: DC | PRN

## 2023-04-15 MED ORDER — GABAPENTIN 300 MG PO CAPS
300.0000 mg | ORAL_CAPSULE | ORAL | Status: DC
  Filled 2023-04-15: qty 1

## 2023-04-15 MED ORDER — SUGAMMADEX SODIUM 200 MG/2ML IV SOLN
INTRAVENOUS | Status: DC | PRN
  Administered 2023-04-15: 270 mg via INTRAVENOUS

## 2023-04-15 MED ORDER — MIDAZOLAM HCL 2 MG/2ML IJ SOLN
INTRAMUSCULAR | Status: DC | PRN
  Administered 2023-04-15: 2 mg via INTRAVENOUS

## 2023-04-15 MED ORDER — DEXAMETHASONE SODIUM PHOSPHATE 10 MG/ML IJ SOLN
INTRAMUSCULAR | Status: AC
  Filled 2023-04-15: qty 1

## 2023-04-15 MED ORDER — GABAPENTIN 300 MG PO CAPS
300.0000 mg | ORAL_CAPSULE | Freq: Two times a day (BID) | ORAL | Status: DC
  Administered 2023-04-15 – 2023-04-17 (×4): 300 mg via ORAL
  Filled 2023-04-15 (×4): qty 3

## 2023-04-15 MED ORDER — KETAMINE HCL 10 MG/ML IJ SOLN
INTRAMUSCULAR | Status: DC | PRN
  Administered 2023-04-15: 30 mg via INTRAVENOUS
  Administered 2023-04-15 (×2): 10 mg via INTRAVENOUS

## 2023-04-15 MED ORDER — BUPIVACAINE LIPOSOME 1.3 % IJ SUSP
INTRAMUSCULAR | Status: AC
  Filled 2023-04-15: qty 20

## 2023-04-15 MED ORDER — DULOXETINE HCL 20 MG PO CPEP
20.0000 mg | ORAL_CAPSULE | Freq: Every day | ORAL | Status: DC
  Administered 2023-04-15 – 2023-04-17 (×3): 20 mg via ORAL
  Filled 2023-04-15 (×3): qty 1

## 2023-04-15 MED ORDER — BUPIVACAINE LIPOSOME 1.3 % IJ SUSP: 20.0000 mL | Freq: Once | INTRAMUSCULAR | Status: DC

## 2023-04-15 MED ORDER — FENTANYL CITRATE (PF) 250 MCG/5ML IJ SOLN
INTRAMUSCULAR | Status: AC
  Filled 2023-04-15: qty 5

## 2023-04-15 MED ORDER — BUPIVACAINE LIPOSOME 1.3 % IJ SUSP
INTRAMUSCULAR | Status: DC | PRN
  Administered 2023-04-15: 20 mL

## 2023-04-15 MED ORDER — PROPOFOL 10 MG/ML IV BOLUS
INTRAVENOUS | Status: DC | PRN
  Administered 2023-04-15: 150 mg via INTRAVENOUS

## 2023-04-15 MED ORDER — ACETAMINOPHEN 160 MG/5ML PO SOLN: 1000.0000 mg | Freq: Three times a day (TID) | ORAL | Status: DC

## 2023-04-15 MED ORDER — BUPIVACAINE-EPINEPHRINE 0.25% -1:200000 IJ SOLN
INTRAMUSCULAR | Status: AC
  Filled 2023-04-15: qty 1

## 2023-04-15 MED ORDER — PANTOPRAZOLE SODIUM 40 MG IV SOLR
40.0000 mg | Freq: Every day | INTRAVENOUS | Status: DC
  Administered 2023-04-15 – 2023-04-16 (×2): 40 mg via INTRAVENOUS
  Filled 2023-04-15 (×2): qty 10

## 2023-04-15 MED ORDER — LACTATED RINGERS IV SOLN: INTRAVENOUS | Status: DC

## 2023-04-15 MED ORDER — LACTATED RINGERS IV SOLN: INTRAVENOUS | Status: DC | PRN

## 2023-04-15 MED ORDER — ACETAMINOPHEN 500 MG PO TABS
1000.0000 mg | ORAL_TABLET | ORAL | Status: AC
  Administered 2023-04-15: 1000 mg via ORAL
  Filled 2023-04-15: qty 2

## 2023-04-15 MED ORDER — 0.9 % SODIUM CHLORIDE (POUR BTL) OPTIME
TOPICAL | Status: DC | PRN
  Administered 2023-04-15: 1000 mL

## 2023-04-15 MED ORDER — ENSURE MAX PROTEIN PO LIQD
2.0000 [oz_av] | ORAL | Status: DC
  Administered 2023-04-16 – 2023-04-17 (×3): 2 [oz_av] via ORAL

## 2023-04-15 MED ORDER — ROCURONIUM BROMIDE 10 MG/ML (PF) SYRINGE
PREFILLED_SYRINGE | INTRAVENOUS | Status: DC | PRN
  Administered 2023-04-15: 10 mg via INTRAVENOUS
  Administered 2023-04-15 (×2): 30 mg via INTRAVENOUS
  Administered 2023-04-15: 100 mg via INTRAVENOUS

## 2023-04-15 MED ORDER — ROCURONIUM BROMIDE 10 MG/ML (PF) SYRINGE
PREFILLED_SYRINGE | INTRAVENOUS | Status: AC
  Filled 2023-04-15: qty 10

## 2023-04-15 MED ORDER — OXYCODONE HCL 5 MG/5ML PO SOLN
5.0000 mg | Freq: Four times a day (QID) | ORAL | Status: DC | PRN
  Administered 2023-04-15: 5 mg via ORAL
  Filled 2023-04-15 (×2): qty 5

## 2023-04-15 MED ORDER — LIDOCAINE HCL (PF) 2 % IJ SOLN
INTRAMUSCULAR | Status: AC
  Filled 2023-04-15: qty 20

## 2023-04-15 MED ORDER — ONDANSETRON HCL 4 MG/2ML IJ SOLN
4.0000 mg | INTRAMUSCULAR | Status: DC | PRN
  Administered 2023-04-15 – 2023-04-16 (×5): 4 mg via INTRAVENOUS
  Filled 2023-04-15 (×5): qty 2

## 2023-04-15 MED ORDER — ONDANSETRON HCL 4 MG/2ML IJ SOLN
INTRAMUSCULAR | Status: AC
  Filled 2023-04-15: qty 2

## 2023-04-15 MED ORDER — PHENYLEPHRINE 80 MCG/ML (10ML) SYRINGE FOR IV PUSH (FOR BLOOD PRESSURE SUPPORT)
PREFILLED_SYRINGE | INTRAVENOUS | Status: DC | PRN
  Administered 2023-04-15: 160 ug via INTRAVENOUS

## 2023-04-15 MED ORDER — FENTANYL CITRATE PF 50 MCG/ML IJ SOSY
PREFILLED_SYRINGE | INTRAMUSCULAR | Status: AC
  Administered 2023-04-15: 50 ug via INTRAVENOUS
  Filled 2023-04-15: qty 3

## 2023-04-15 MED ORDER — KETAMINE HCL 50 MG/5ML IJ SOSY
PREFILLED_SYRINGE | INTRAMUSCULAR | Status: AC
  Filled 2023-04-15: qty 5

## 2023-04-15 MED ORDER — ORAL CARE MOUTH RINSE: 15.0000 mL | Freq: Once | OROMUCOSAL | Status: AC

## 2023-04-15 MED ORDER — ONDANSETRON HCL 4 MG/2ML IJ SOLN
INTRAMUSCULAR | Status: DC | PRN
  Administered 2023-04-15: 4 mg via INTRAVENOUS

## 2023-04-15 MED ORDER — CHLORHEXIDINE GLUCONATE CLOTH 2 % EX PADS: 6.0000 | MEDICATED_PAD | Freq: Once | CUTANEOUS | Status: DC

## 2023-04-15 MED ORDER — PHENYLEPHRINE HCL-NACL 20-0.9 MG/250ML-% IV SOLN
INTRAVENOUS | Status: DC | PRN
  Administered 2023-04-15: 50 ug/min via INTRAVENOUS

## 2023-04-15 MED ORDER — HEPARIN SODIUM (PORCINE) 5000 UNIT/ML IJ SOLN
5000.0000 [IU] | Freq: Three times a day (TID) | INTRAMUSCULAR | Status: DC
  Administered 2023-04-15 – 2023-04-17 (×5): 5000 [IU] via SUBCUTANEOUS
  Filled 2023-04-15 (×5): qty 1

## 2023-04-15 MED ORDER — PROPOFOL 10 MG/ML IV BOLUS
INTRAVENOUS | Status: AC
  Filled 2023-04-15: qty 20

## 2023-04-15 MED ORDER — MIDAZOLAM HCL 2 MG/2ML IJ SOLN
INTRAMUSCULAR | Status: AC
  Filled 2023-04-15: qty 2

## 2023-04-15 MED ORDER — FENTANYL CITRATE (PF) 250 MCG/5ML IJ SOLN
INTRAMUSCULAR | Status: DC | PRN
  Administered 2023-04-15 (×3): 50 ug via INTRAVENOUS
  Administered 2023-04-15: 100 ug via INTRAVENOUS

## 2023-04-15 MED ORDER — CHLORHEXIDINE GLUCONATE 0.12 % MT SOLN
15.0000 mL | Freq: Once | OROMUCOSAL | Status: AC
  Administered 2023-04-15: 15 mL via OROMUCOSAL

## 2023-04-15 MED ORDER — LACTATED RINGERS IR SOLN
Status: DC | PRN
  Administered 2023-04-15: 1000 mL

## 2023-04-15 MED ORDER — ACETAMINOPHEN 500 MG PO TABS
1000.0000 mg | ORAL_TABLET | Freq: Three times a day (TID) | ORAL | Status: DC
  Administered 2023-04-15 – 2023-04-17 (×5): 1000 mg via ORAL
  Filled 2023-04-15 (×5): qty 2

## 2023-04-15 MED ORDER — MIDAZOLAM HCL 2 MG/2ML IJ SOLN: INTRAMUSCULAR | Status: DC | PRN

## 2023-04-15 MED ORDER — LIDOCAINE HCL (PF) 2 % IJ SOLN
INTRAMUSCULAR | Status: AC
  Filled 2023-04-15: qty 5

## 2023-04-15 MED ORDER — PHENYLEPHRINE 80 MCG/ML (10ML) SYRINGE FOR IV PUSH (FOR BLOOD PRESSURE SUPPORT)
PREFILLED_SYRINGE | INTRAVENOUS | Status: AC
  Filled 2023-04-15: qty 10

## 2023-04-15 SURGICAL SUPPLY — 75 items
ADH SKN CLS APL DERMABOND .7 (GAUZE/BANDAGES/DRESSINGS) ×1
ANTIFOG SOL W/FOAM PAD STRL (MISCELLANEOUS) ×1
APL PRP STRL LF DISP 70% ISPRP (MISCELLANEOUS) ×2
APPLIER CLIP 5 13 M/L LIGAMAX5 (MISCELLANEOUS)
APPLIER CLIP ROT 10 11.4 M/L (STAPLE)
APR CLP MED LRG 11.4X10 (STAPLE)
APR CLP MED LRG 5 ANG JAW (MISCELLANEOUS)
BLADE SURG SZ11 CARB STEEL (BLADE) ×1 IMPLANT
CANNULA REDUCER 12-8 DVNC XI (CANNULA) ×1 IMPLANT
CHLORAPREP W/TINT 26 (MISCELLANEOUS) ×2 IMPLANT
CLIP APPLIE 5 13 M/L LIGAMAX5 (MISCELLANEOUS) IMPLANT
CLIP APPLIE ROT 10 11.4 M/L (STAPLE) IMPLANT
COVER SURGICAL LIGHT HANDLE (MISCELLANEOUS) ×1 IMPLANT
COVER TIP SHEARS 8 DVNC (MISCELLANEOUS) ×1 IMPLANT
DERMABOND ADVANCED .7 DNX12 (GAUZE/BANDAGES/DRESSINGS) ×1 IMPLANT
DRAPE ARM DVNC X/XI (DISPOSABLE) ×4 IMPLANT
DRAPE COLUMN DVNC XI (DISPOSABLE) ×1 IMPLANT
DRIVER NDL LRG 8 DVNC XI (INSTRUMENTS) ×1 IMPLANT
DRIVER NDL MEGA SUTCUT DVNCXI (INSTRUMENTS) ×1 IMPLANT
DRIVER NDLE LRG 8 DVNC XI (INSTRUMENTS) ×1 IMPLANT
DRIVER NDLE MEGA SUTCUT DVNCXI (INSTRUMENTS) IMPLANT
ELECT REM PT RETURN 15FT ADLT (MISCELLANEOUS) ×1 IMPLANT
FORCEPS CADIERE DVNC XI (FORCEP) ×1 IMPLANT
GAUZE 4X4 16PLY ~~LOC~~+RFID DBL (SPONGE) ×1 IMPLANT
GLOVE BIO SURGEON STRL SZ7.5 (GLOVE) ×2 IMPLANT
GLOVE INDICATOR 8.0 STRL GRN (GLOVE) ×2 IMPLANT
GOWN STRL REUS W/ TWL XL LVL3 (GOWN DISPOSABLE) ×3 IMPLANT
GOWN STRL REUS W/TWL XL LVL3 (GOWN DISPOSABLE) ×3
GRASPER SUT TROCAR 14GX15 (MISCELLANEOUS) ×1 IMPLANT
GRASPER TIP-UP FEN DVNC XI (INSTRUMENTS) ×1 IMPLANT
IRRIG SUCT STRYKERFLOW 2 WTIP (MISCELLANEOUS)
IRRIGATION SUCT STRKRFLW 2 WTP (MISCELLANEOUS) IMPLANT
IRRIGATOR SUCT 8 DISP DVNC XI (IRRIGATION / IRRIGATOR) IMPLANT
KIT BASIN OR (CUSTOM PROCEDURE TRAY) ×1 IMPLANT
KIT GASTRIC LAVAGE 34FR ADT (SET/KITS/TRAYS/PACK) ×1 IMPLANT
KIT TURNOVER KIT A (KITS) IMPLANT
LUBRICANT JELLY K Y 4OZ (MISCELLANEOUS) IMPLANT
MARKER SKIN DUAL TIP RULER LAB (MISCELLANEOUS) ×1 IMPLANT
MAT PREVALON FULL STRYKER (MISCELLANEOUS) ×1 IMPLANT
NDL SPNL 18GX3.5 QUINCKE PK (NEEDLE) ×1 IMPLANT
NEEDLE SPNL 18GX3.5 QUINCKE PK (NEEDLE) ×1 IMPLANT
OBTURATOR OPTICAL STND 8 DVNC (TROCAR) ×1
OBTURATOR OPTICALSTD 8 DVNC (TROCAR) ×1 IMPLANT
PACK CARDIOVASCULAR III (CUSTOM PROCEDURE TRAY) ×1 IMPLANT
RELOAD STAPLE 60 2.5 WHT DVNC (STAPLE) ×3 IMPLANT
RELOAD STAPLE 60 3.5 BLU DVNC (STAPLE) IMPLANT
RELOAD STAPLER 2.5X60 WHT DVNC (STAPLE) ×9 IMPLANT
RELOAD STAPLER 3.5X60 BLU DVNC (STAPLE) IMPLANT
SCISSORS MNPLR CVD DVNC XI (INSTRUMENTS) ×1 IMPLANT
SEAL UNIV 5-12 XI (MISCELLANEOUS) ×4 IMPLANT
SEALER VESSEL EXT DVNC XI (MISCELLANEOUS) ×1 IMPLANT
SET TUBE SMOKE EVAC HIGH FLOW (TUBING) ×1 IMPLANT
SOL ELECTROSURG ANTI STICK (MISCELLANEOUS) ×1
SOLUTION ANTFG W/FOAM PAD STRL (MISCELLANEOUS) ×1 IMPLANT
SOLUTION ELECTROSURG ANTI STCK (MISCELLANEOUS) ×1 IMPLANT
SPIKE FLUID TRANSFER (MISCELLANEOUS) ×1 IMPLANT
STAPLER 60 SUREFORM DVNC (STAPLE) ×1 IMPLANT
STAPLER RELOAD 2.5X60 WHT DVNC (STAPLE) ×9
STAPLER RELOAD 3.5X60 BLU DVNC (STAPLE)
SUT MNCRL AB 4-0 PS2 18 (SUTURE) ×1 IMPLANT
SUT SILK 0 SH 30 (SUTURE) IMPLANT
SUT SILK 2 0 SH (SUTURE) ×2 IMPLANT
SUT V-LOC BARB 180 2/0GR6 GS22 (SUTURE) ×2
SUT VIC AB 2-0 SH 27 (SUTURE)
SUT VIC AB 2-0 SH 27XBRD (SUTURE) IMPLANT
SUT VICRYL 0 TIES 12 18 (SUTURE) ×1 IMPLANT
SUT VLOC BARB 180 ABS3/0GR12 (SUTURE) ×2
SUTURE V-LC BRB 180 2/0GR6GS22 (SUTURE) ×2 IMPLANT
SUTURE VLOC BRB 180 ABS3/0GR12 (SUTURE) ×2 IMPLANT
SYR 20ML LL LF (SYRINGE) ×1 IMPLANT
TOWEL OR 17X26 10 PK STRL BLUE (TOWEL DISPOSABLE) ×1 IMPLANT
TRAY FOLEY MTR SLVR 16FR STAT (SET/KITS/TRAYS/PACK) IMPLANT
TROCAR ADV FIXATION 12X100MM (TROCAR) IMPLANT
TROCAR Z-THREAD FIOS 5X100MM (TROCAR) ×1 IMPLANT
TUBE CALIBRATION LAPBAND (TUBING) IMPLANT

## 2023-04-15 NOTE — Op Note (Signed)
Patient: Heather Mckinney (06-Aug-1971, [id number])  Date of Surgery: 04/15/2023   Preoperative Diagnosis: MORBID OBESITY BMI (45.25), HIATAL HERNIA  Postoperative Diagnosis: MORBID OBESITY BMI (45.25), HIATAL HERNIA  Surgical Procedure: XI ROBOT ASSISTED ROUX-EN-Y WITH UPPER ENDO: 40981 (CPT) HERNIA REPAIR HIATAL:    Operative Team Members:  Surgeon(s) and Role:    * Elvenia Godden, Hyman Hopes, MD - Primary    * Kinsinger, De Blanch, MD - Assisting   Anesthesiologist: Elmer Picker, MD CRNA: Doran Clay, CRNA; Florene Route, CRNA   Anesthesia: General   Fluids:  Total I/O In: 1550 [I.V.:1450; IV Piggyback:100] Out: 25 [Blood:25]  Complications: None  Drains:  none   Specimen: * No specimens in log *   Disposition:  PACU - hemodynamically stable.  Plan of Care: Admit for overnight observation    Indications for Procedure: Marcina Kinnison is a 52 y.o. female who presented with obesity, BMI 45.25, and a hiatal hernia.  I recommended robotic gastric bypass with upper endoscopy and hiatal hernia repair.  We discussed the procedure, its risks, benefits and alterantives in detail.  After a full discussion and all questions answered, the patient granted consent to proceed.    Findings: Hiatal hernia  Infection status: Patient: Private Patient Elective Case Case: Elective Infection Present At Time Of Surgery (PATOS): Some spillage of foregut  and jejunal contents while creating anastomoses   Description of Procedure:   On the date stated above, the patient was taken to the operating room suite and placed in supine positioning.  General endotracheal anesthesia was induced.  A timeout was completed verifying the correct patient, procedure, positioning and equipment needed for the case.  The patient's abdomen was prepped and draped in the usual sterile fashion.  A 5 mm trocar was used to enter the right upper quadrant using optical technique.  The abdomen was entered  safely without any trauma the underlying viscera.  Three additional incisions were made and 4 robotic trochars were placed across the abdomen, replacing the 5 mm trocar with the 12 mm robotic stapler trocar.  The [facility] liver retractor was placed through the subxiphoid region and under the left lobe of the liver and was connected to the rail of the bed.  A TAP block was placed using marcaine and Exparel under direct vision of the laparoscope.  The [employer] XI robotic platform was docked and we transitioned to robotic surgery.  A hiatal hernia was identified and repaired.    Beginning at the twelve o'clock position on the crus, the hernia sac was grasped and reduced form the mediastinum.  The hernia sac was divided to enter the plane between the sac and the mediastinum.  The hernia sac was divided towards the left and right with continued traction on the hernia sac to reduce it.  A mediastinal dissection was performed to further reduce the hernia sac which facilitated reduction of the stomach as well.  The hernia sac was disconnected from the right and left crura and subsequently, the hernia sac was dissected from the stomach and esophagus, and excised.  All posterior gastric attachments to the lesser sac and retroperitoneum were divided.  The left crus was further delineated.  A retroesophageal window was created and used to for esophageal retraction.    A high, circumferential mediastinal dissection was performed in an effort to mobilize the esophagus and provide for adequate intraabdominal esophageal length.  The mediastinal dissection was performed bluntly, with little to no thermal energy.  The anterior  and posterior vagus nerves were both identified and preserved.  The crural defect was reapproximated with multiple, interrupted, 0 silk sutures.  The crural pillars came together well without tearing of the adjacent diaphragmatic tissue.    Using the tip up grasper, fenestrated bipolar, 30 degree  camera and Vessel Sealer from the patient's right to left, we began by dissecting the angle of His off the left crus of the diaphragm.  The adhesions between the stomach, spleen and diaphragm were divided using the Vessel Sealer to define the angle of His.  I then started 4-6 cm down on the lesser curve of the stomach and created a defect in the gastrohepatic ligament tracking behind the lesser curve of the stomach to enter the lesser sac.  I then used multiple white loads of the robotic 60 mm Sureform linear stapler to form the gastric pouch.  I then directed my attention to the lower abdomen.  The omentum was divided with the Vessel Sealer and I identified the ligament of treitz.  The jejunum was run to a point 50 cm from the ligament of Treitz.  This loop of bowel was then brought into the left upper quadrant, over the transverse colon, between the split omentum.  A 2-0 v-loc suture was used to create the posterior outer row of the gastrojejunal anastomosis.  An approximately 2 cm gastrotomy was made in the pouch and a matching 2 cm enterotomy was created in the roux limb.  Then, two 3-0 v-loc sutures were used to create a posterior, inner, full thickness layer of the anastomosis.  While sewing the anterior inner layer, the Ewald tube was passed through the anastomosis to ensure patency.  The outer, anterior layer was then created using 2-0 v-loc suture.  A window was made in the jejunal mesentery and the jejunum was divided just proximal to the gastrojejunal anastomosis using a white load of the robotic 60 mm Sureform linear stapler to divide the roux limb from the hepatobiliary limb.  At this point the gastrojejunal anastomosis was complete and the Ewald tube was removed.  The roux limb was then run 100 cm from the gastrojejunal anastomosis.  This loop of bowel was brought into the left upper quadrant for anastomosis to the hepatobiliary limb.  A robotic 60 mm white load on the Sureform linear stapler was  introduced into both limbs and fired to create the jejunojejunostomy.  A second 60 mm white load of the Sureform linear stapler was fired to connect the distal Roux limb to the proximal common channel creating a W shaped anastomosis.  The common enterotomy was closed with a final 60 mm white load of the Sureform linear stapler.  The jejunojejunostomy mesenteric defect was closed using running 0-Ethibond suture.   The retro-roux defect was closed, closing the roux limb mesentery to the transverse mesocolon mesentery using a running 0-Ethibond suture.   I ran the roux limb from the gastrojejunal anastomosis to the jejunojejunostomy and found the anatomy as expected without any twisting of the mesentery.  The intra-abdominal pressure was decreased to for the remainder of the case to check for hemostasis.  I then transitioned to endoscopic portion of the procedure.  The adult upper endoscope was inserted into the pouch.  The pouch appeared appropriately sized.  There was good intra-luminal hemostasis.  The endoscope was inserted through the anastomosis into the roux limb.  The anastomosis appeared well formed without any stricture.  The anastomosis was submerged in irrigation in the left upper  quadrant and there was no leakage of air bubbles with endoscopic insufflation suggesting a negative leak test and an air tight anastomosis.    The foregut was decompressed with the endoscope and the endoscope was removed.  The robotic instruments were removed and the robot was undocked.  The stapler port in the right abdomen was closed at the fascial level using 0-vicryl on a PMI suture passer.  The liver retractor was removed under direct vision.  The pneumoperitoneum was evacuated.  The skin was closed using 4-0 Monocryl and Dermabond.  All sponge and needle counts were correct at the end of the case.    Ivar Drape, MD General, Bariatric, & Minimally Invasive Surgery [facility], [place]

## 2023-04-15 NOTE — Progress Notes (Signed)
PHARMACY CONSULT FOR:  Risk Assessment for Post-Discharge VTE Following Bariatric Surgery  Post-Discharge VTE Risk Assessment: This patient's probability of 30-day post-discharge VTE is increased due to the factors marked:  Sleeve gastrectomy   Liver disorder (transplant, cirrhosis, or nonalcoholic steatohepatitis)   Hx of VTE   Hemorrhage requiring transfusion   GI perforation, leak, or obstruction   ====================================================    Female    Age >/=60 years    BMI >/=50 kg/m2    CHF    Dyspnea at Rest    Paraplegia   x Non-gastric-band surgery    Operation Time >/=3 hr    Return to OR     Length of Stay >/= 3 d   Hypercoagulable condition   Significant venous stasis      Predicted probability of 30-day post-discharge VTE: 0.16% mild  Other patient-specific factors to consider: none  Recommendation for Discharge: No pharmacologic prophylaxis post-discharge   Heather Mckinney is a 52 y.o. female who underwent  04/15/23 ROUX-EN-Y    Case start: 0750 Case end: 1043   Allergies  Allergen Reactions   Ephedrine Other (See Comments)    Patient states she went into cardiac arrest after taking sudafed.   Aspirin Other (See Comments)    Hearing loss     Patient Measurements: Height:  (172.7 cm) Weight: 135 kg (297 lb 9.6 oz) (Simultaneous filing. User may not have seen previous data.) IBW/kg (Calculated) : 63.9 Body mass index is 45.25 kg/m.  No results for input(s): "WBC", "HGB", "HCT", "PLT", "APTT", "CREATININE", "LABCREA", "CREAT24HRUR", "MG", "PHOS", "ALBUMIN", "PROT", "AST", "ALT", "ALKPHOS", "BILITOT", "BILIDIR", "IBILI" in the last 72 hours. CrCl cannot be calculated (Patient's most recent lab result is older than the maximum 21 days allowed.).    Past Medical History:  Diagnosis Date   Anxiety    Arthritis    Asthma    COPD (chronic obstructive pulmonary disease)    Depression    Fibromyalgia    Migraine      Medications  Prior to Admission  Medication Sig Dispense Refill Last Dose   Cholecalciferol (DIALYVITE VITAMIN D 5000) 125 MCG (5000 UT) capsule Take 5,000 Units by mouth daily.   Past Week   Cyanocobalamin (B-12) 2500 MCG TABS Take 2,500 mcg by mouth daily.   Past Week   diclofenac (VOLTAREN) 75 MG EC tablet Take 75 mg by mouth 2 (two) times daily.      DULoxetine (CYMBALTA) 20 MG capsule Take 20 mg by mouth daily.   04/14/2023 at 2000   gabapentin (NEURONTIN) 300 MG capsule Take 300 mg by mouth 2 (two) times daily.   04/15/2023   magnesium oxide (MAG-OX) 400 (240 Mg) MG tablet Take 400 mg by mouth at bedtime.   Past Week   naproxen sodium (ALEVE) 220 MG tablet Take 440 mg by mouth daily as needed (pain).   Past Week   pantoprazole (PROTONIX) 40 MG tablet Take 1 tablet (40 mg total) by mouth daily. 90 tablet 3 04/15/2023 at 0430   AJOVY 225 MG/1.5ML SOSY INJECT 225 MG INTO THE SKIN EVERY 30 DAYS (Patient not taking: Reported on 02/06/2023) 1.5 mL 9 Not Taking   traMADol (ULTRAM) 50 MG tablet Take 1 tablet (50 mg total) by mouth every 6 (six) hours as needed. (Patient not taking: Reported on 02/06/2023) 10 tablet 0 Not Taking   UBRELVY 100 MG TABS TAKE 1 TABLET BY MOUTH EVERY 2 HOURS AS NEEDED (MAX OF 200 MG A DAY) (Patient not taking: Reported  on 02/06/2023) 10 tablet 9 Not Taking     Chelsie Burel S. Merilynn Finland, PharmD, BCPS Clinical Staff Pharmacist [email or web address]  Merilynn Finland, Nicholette Dolson Stillinger 04/15/2023,11:44 AM

## 2023-04-15 NOTE — H&P (Signed)
Admitting Physician: Hyman Hopes Kamrie Fanton  Service: Bariatric surgery  CC: Obesity  Subjective   HPI: Heather Mckinney is an 52 y.o. female who is here for Gastric bypass  Past Medical History:  Diagnosis Date   Anxiety    Arthritis    Asthma    COPD (chronic obstructive pulmonary disease)    Depression    Fibromyalgia    Migraine     Past Surgical History:  Procedure Laterality Date   BIOPSY  02/10/2023   Procedure: BIOPSY;  Surgeon: Quentin Ore, MD;  Location: [facility];  Service: General;;   CESAREAN SECTION     x 3   CHOLECYSTECTOMY     ESOPHAGOGASTRODUODENOSCOPY N/A 02/10/2023   Procedure: ESOPHAGOGASTRODUODENOSCOPY (EGD);  Surgeon: Quentin Ore, MD;  Location: [facility];  Service: General;  Laterality: N/A;    Family History  Problem Relation Age of Onset   Diabetes Mother    Hypertension Mother    Asthma Mother    Cerebral aneurysm Mother    Heart Problems Mother    Migraines Mother    Migraines Daughter    Vision loss Daughter    Hydrocephalus Son     Social:  reports that she has never smoked. She has been exposed to tobacco smoke. She has never used smokeless tobacco. She reports that she does not currently use alcohol. She reports that she does not use drugs.  Allergies:  Allergies  Allergen Reactions   Ephedrine Other (See Comments)    Patient states she went into cardiac arrest after taking sudafed.   Aspirin Other (See Comments)    Hearing loss     Medications: Current Outpatient Medications  Medication Instructions   AJOVY 225 MG/1.5ML SOSY INJECT 225 MG INTO THE SKIN EVERY 30 DAYS   B-12 2,500 mcg, Oral, Daily   Dialyvite Vitamin D 5000 5,000 Units, Oral, Daily   diclofenac (VOLTAREN) 75 mg, Oral, 2 times daily   DULoxetine (CYMBALTA) 20 mg, Oral, Daily   gabapentin (NEURONTIN) 300 mg, Oral, 2 times daily   magnesium oxide (MAG-OX) 400 mg, Oral, Daily at bedtime   naproxen sodium (ALEVE) 440 mg, Oral, Daily  PRN   pantoprazole (PROTONIX) 40 mg, Oral, Daily   traMADol (ULTRAM) 50 mg, Oral, Every 6 hours PRN   UBRELVY 100 MG TABS TAKE 1 TABLET BY MOUTH EVERY 2 HOURS AS NEEDED (MAX OF 200 MG A DAY)    ROS - all of the below systems have been reviewed with the patient and positives are indicated with bold text General: chills, fever or night sweats Eyes: blurry vision or double vision ENT: epistaxis or sore throat Allergy/Immunology: itchy/watery eyes or nasal congestion Hematologic/Lymphatic: bleeding problems, blood clots or swollen lymph nodes Endocrine: temperature intolerance or unexpected weight changes Breast: new or changing breast lumps or nipple discharge Resp: cough, shortness of breath, or wheezing CV: chest pain or dyspnea on exertion GI: as per HPI GU: dysuria, trouble voiding, or hematuria MSK: joint pain or joint stiffness Neuro: TIA or stroke symptoms Derm: pruritus and skin lesion changes Psych: anxiety and depression  Objective   PE Blood pressure 123/81, pulse 69, temperature 98.1 F (36.7 C), temperature source Oral, resp. rate 16, height  (1.727 m), weight 135 kg, last menstrual period 12/25/2019, SpO2 94 %. Constitutional: NAD; conversant; no deformities Eyes: Moist conjunctiva; no lid lag; anicteric; PERRL Neck: Trachea midline; no thyromegaly Lungs: Normal respiratory effort; no tactile fremitus CV: RRR; no palpable thrills; no pitting edema GI:  Abd Soft, nontender; no palpable hepatosplenomegaly MSK: Normal range of motion of extremities; no clubbing/cyanosis Psychiatric: Appropriate affect; alert and oriented x3 Lymphatic: No palpable cervical or axillary lymphadenopathy  No results found for this or any previous visit (from the past 24 hour(s)).  Imaging Orders  No imaging studies ordered today     Assessment and Plan   Heather Mckinney is an 52 y.o. female with obesity (BMI 45.25).  We discussed the surgical options to treat obesity and its  associated comorbidity. After discussing the available procedures in the region, we discussed in great detail the surgeries I offer: robotic sleeve gastrectomy and robotic roux-en-y gastric bypass. We discussed the procedures themselves as well as their risks, benefits and alternatives. I entered the patient's basic information into the [facility] Metabolic Surgery Risk/Benefit Calculator to facilitate this discussion.   After initial consultation she was interested in pursuing a robotic sleeve gastrectomy  She has completed the bariatric surgery preoperative pathway to include the following: - Bloodwork - Dietician consult - Chest x-ray - EKG - Psychology evaluation - Upper endoscopy with biopsy. Some gastritis and a hiatal hernia noted  She states she is up-to-date with her colon cancer screening having recently completed a Cologuard. She had a sleep study 2 years ago which was negative.  She presented to the office again to discuss the results of her upper endoscopy and to discuss the finding of a hiatal hernia. She has no symptoms of acid reflux at this time. I expressed my concern of doing sleeve gastrectomy at the time of hiatal hernia repair due to the high risk of the hiatal hernia return in the future in the risk of worsening acid reflux after a sleeve gastrectomy. It also would make sense that it is more common for hiatal hernia return after her sleeve gastrectomy as opposed to a gastric bypass. As she is lower risk for either operation based on the risk calculator, I feel she may benefit from gastric bypass upfront to avoid issues with acid reflux and possible need for recurrent hiatal hernia repairs.  She voiced understanding of this logic and does want to proceed with robotic gastric bypass with upper endoscopy and hiatal hernia repair. We discussed this procedure as well as its risk, benefits, and alternatives. We discussed the perioperative care and postoperative recovery. After full  discussion all questions answered the patient granted consent to proceed. Today she presents for surgery and we will proceed as scheduled.  Quentin Ore, MD  [facility] Use [email or web address] to contact on call provider

## 2023-04-15 NOTE — Anesthesia Postprocedure Evaluation (Signed)
Anesthesia Post Note  Patient: Heather Mckinney  Procedure(s) Performed: XI ROBOT ASSISTED ROUX-EN-Y WITH UPPER ENDO HERNIA REPAIR HIATAL     Patient location during evaluation: PACU Anesthesia Type: General Level of consciousness: awake and alert Pain management: pain level controlled Vital Signs Assessment: post-procedure vital signs reviewed and stable Respiratory status: spontaneous breathing, nonlabored ventilation, respiratory function stable and patient connected to nasal cannula oxygen Cardiovascular status: blood pressure returned to baseline and stable Postop Assessment: no apparent nausea or vomiting Anesthetic complications: no  No notable events documented.  Last Vitals:  Vitals:   04/15/23 1215 04/15/23 1232  BP: 131/77 125/85  Pulse: 64 69  Resp: 10 18  Temp:    SpO2: 95% 95%    Last Pain:  Vitals:   04/15/23 1200  TempSrc:   PainSc: 3                  [name] L [name]

## 2023-04-15 NOTE — Anesthesia Procedure Notes (Signed)
Procedure Name: Intubation Date/Time: 04/15/2023 7:23 AM  Performed by: Florene Route, CRNAPre-anesthesia Checklist: Patient identified, Emergency Drugs available, Suction available and Patient being monitored Patient Re-evaluated:Patient Re-evaluated prior to induction Oxygen Delivery Method: Circle system utilized Preoxygenation: Pre-oxygenation with 100% oxygen Induction Type: IV induction Ventilation: Mask ventilation without difficulty and Oral airway inserted - appropriate to patient size Laryngoscope Size: Hyacinth Meeker and 2 Grade View: Grade I Tube type: Oral Tube size: 7.5 mm Number of attempts: 1 Airway Equipment and Method: Stylet Placement Confirmation: ETT inserted through vocal cords under direct vision, positive ETCO2 and breath sounds checked- equal and bilateral Secured at: 21 cm Tube secured with: Tape Dental Injury: Teeth and Oropharynx as per pre-operative assessment

## 2023-04-15 NOTE — Transfer of Care (Signed)
Immediate Anesthesia Transfer of Care Note  Patient: Heather Mckinney  Procedure(s) Performed: XI ROBOT ASSISTED ROUX-EN-Y WITH UPPER ENDO HERNIA REPAIR HIATAL  Patient Location: PACU  Anesthesia Type:General  Level of Consciousness: drowsy  Airway & Oxygen Therapy: Patient Spontanous Breathing and Patient connected to face mask oxygen  Post-op Assessment: Report given to RN and Post -op Vital signs reviewed and stable  Post vital signs: Reviewed and stable  Last Vitals:  Vitals Value Taken Time  BP 130/89 04/15/23 1055  Temp    Pulse 68 04/15/23 1058  Resp 8 04/15/23 1058  SpO2 100 % 04/15/23 1058  Vitals shown include unvalidated device data.  Last Pain:  Vitals:   04/15/23 0603  TempSrc: Oral  PainSc: 0-No pain         Complications: No notable events documented.

## 2023-04-15 NOTE — Progress Notes (Signed)
Discussed QI "Goals for Discharge" document with patient including ambulation in halls, Incentive Spirometry use every hour, and oral care.  Also discussed pain and nausea control.  Enabled or verified head of bed 30 degree alarm activated.  BSTOP education provided including BSTOP information guide, "Guide for Pain Management after your Bariatric Procedure".  Diet progression education provided including "Bariatric Surgery Post-Op Food Plan Phase 1: Liquids".  Questions answered.  Will continue to partner with bedside RN and follow up with patient per protocol.    Thank you,  Edilson Vital Arliene Rosenow, RN, MSN Bariatric Nurse Coordinator [phone] (office)  

## 2023-04-16 ENCOUNTER — Encounter (HOSPITAL_COMMUNITY): Payer: Self-pay | Admitting: Surgery

## 2023-04-16 LAB — CBC WITH DIFFERENTIAL/PLATELET
Abs Immature Granulocytes: 0.05 10*3/uL (ref 0.00–0.07)
Basophils Absolute: 0 10*3/uL (ref 0.0–0.1)
Basophils Relative: 0 %
Eosinophils Absolute: 0 10*3/uL (ref 0.0–0.5)
Eosinophils Relative: 0 %
HCT: 39.2 % (ref 36.0–46.0)
Hemoglobin: 12.7 g/dL (ref 12.0–15.0)
Immature Granulocytes: 0 %
Lymphocytes Relative: 13 %
Lymphs Abs: 1.6 10*3/uL (ref 0.7–4.0)
MCH: 30.1 pg (ref 26.0–34.0)
MCHC: 32.4 g/dL (ref 30.0–36.0)
MCV: 92.9 fL (ref 80.0–100.0)
Monocytes Absolute: 1 10*3/uL (ref 0.1–1.0)
Monocytes Relative: 8 %
Neutro Abs: 9.4 10*3/uL — ABNORMAL HIGH (ref 1.7–7.7)
Neutrophils Relative %: 79 %
Platelets: 294 10*3/uL (ref 150–400)
RBC: 4.22 MIL/uL (ref 3.87–5.11)
RDW: 12.3 % (ref 11.5–15.5)
WBC: 12.1 10*3/uL — ABNORMAL HIGH (ref 4.0–10.5)
nRBC: 0 % (ref 0.0–0.2)

## 2023-04-16 MED ORDER — PANTOPRAZOLE SODIUM 40 MG PO TBEC
40.0000 mg | DELAYED_RELEASE_TABLET | Freq: Every day | ORAL | 0 refills | Status: AC
Start: 2023-04-16 — End: ?

## 2023-04-16 MED ORDER — ONDANSETRON 4 MG PO TBDP
4.0000 mg | ORAL_TABLET | Freq: Four times a day (QID) | ORAL | 0 refills | Status: AC | PRN
Start: 2023-04-16 — End: ?

## 2023-04-16 MED ORDER — PROCHLORPERAZINE EDISYLATE 10 MG/2ML IJ SOLN: 10.0000 mg | INTRAMUSCULAR | Status: DC | PRN

## 2023-04-16 MED ORDER — METHOCARBAMOL 750 MG PO TABS: 750.0000 mg | ORAL_TABLET | Freq: Four times a day (QID) | ORAL | 0 refills | Status: AC | PRN

## 2023-04-16 MED ORDER — OXYCODONE HCL 5 MG PO TABS
5.0000 mg | ORAL_TABLET | Freq: Four times a day (QID) | ORAL | 0 refills | Status: DC | PRN
Start: 2023-04-16 — End: 2023-06-06

## 2023-04-16 MED ORDER — ACETAMINOPHEN 500 MG PO TABS: 1000.0000 mg | ORAL_TABLET | Freq: Three times a day (TID) | ORAL | 0 refills | Status: AC

## 2023-04-16 MED ORDER — METOCLOPRAMIDE HCL 5 MG/ML IJ SOLN
10.0000 mg | Freq: Four times a day (QID) | INTRAMUSCULAR | Status: DC
  Administered 2023-04-16 – 2023-04-17 (×4): 10 mg via INTRAVENOUS
  Filled 2023-04-16 (×4): qty 2

## 2023-04-16 NOTE — Progress Notes (Signed)
Progress Note: General Surgery Service   Chief Complaint/Subjective: Doing okay POD1.  Working on protein.  Objective: Vital signs in last 24 hours: Temp:  [97.6 F (36.4 C)-98.7 F (37.1 C)] 98.4 F (36.9 C) (04/17 0413) Pulse Rate:  [62-80] 78 (04/17 0413) Resp:  [10-18] 18 (04/17 0413) BP: (98-157)/(74-96) 156/92 (04/17 0413) SpO2:  [83 %-100 %] 99 % (04/17 0413) Last BM Date : 04/14/23  Intake/Output from previous day: 04/16 0701 - 04/17 0700 In: 3088.8 [P.O.:510; I.V.:2478.8; IV Piggyback:100] Out: 1225 [Urine:1200; Blood:25] Intake/Output this shift: No intake/output data recorded.  Constitutional: NAD; conversant; no deformities Eyes: Moist conjunctiva; no lid lag; anicteric; PERRL Neck: Trachea midline; no thyromegaly Lungs: Normal respiratory effort; no tactile fremitus CV: RRR; no palpable thrills; no pitting edema GI: Abd Soft, incisions c/d/I w/ glue; no palpable hepatosplenomegaly MSK: Normal range of motion of extremities; no clubbing/cyanosis Psychiatric: Appropriate affect; alert and oriented x3 Lymphatic: No palpable cervical or axillary lymphadenopathy  Lab Results: CBC  Recent Labs    04/15/23 1157 04/15/23 2055 04/16/23 0430  WBC 13.7*  --  12.1*  HGB 13.4 13.1 12.7  HCT 42.0 40.6 39.2  PLT 281  --  294   BMET Recent Labs    04/15/23 1157  CREATININE 0.73   PT/INR No results for input(s): "LABPROT", "INR" in the last 72 hours. ABG No results for input(s): "PHART", "HCO3" in the last 72 hours.  Invalid input(s): "PCO2", "PO2"  Anti-infectives: Anti-infectives (From admission, onward)    Start     Dose/Rate Route Frequency Ordered Stop   04/15/23 0600  ceFAZolin (ANCEF) IVPB 3g/100 mL premix        3 g 200 mL/hr over 30 Minutes Intravenous On call to O.R. 04/15/23 0539 04/15/23 0735       Medications: Scheduled Meds:  acetaminophen  1,000 mg Oral Q8H   Or   acetaminophen (TYLENOL) oral liquid 160 mg/5 mL  1,000 mg Oral Q8H    DULoxetine  20 mg Oral Daily   gabapentin  300 mg Oral BID   heparin injection (subcutaneous)  5,000 Units Subcutaneous Q8H   pantoprazole (PROTONIX) IV  40 mg Intravenous QHS   Ensure Max Protein  2 oz Oral Q2H   Continuous Infusions:  lactated ringers 75 mL/hr at 04/15/23 2108   PRN Meds:.morphine injection, ondansetron (ZOFRAN) IV, oxyCODONE, simethicone  Assessment/Plan: s/p Procedure(s): XI ROBOT ASSISTED ROUX-EN-Y WITH UPPER ENDO HERNIA REPAIR HIATAL 04/15/2023  Continue bariatric protocol, possibly home later today vs. Tomorrow depending on progress with diet.   LOS: 0 days     Quentin Ore, MD  [facility] Use [email or web address] to contact on call provider  Daily Billing: [id number] - post op

## 2023-04-16 NOTE — Progress Notes (Signed)
  Transition of Care [facility]) Screening Note   Patient Details  Name: Heather Mckinney Date of Birth: January 13, 1971   Transition of Care (TOC) CM/SW Contact:    Armanda Heritage, RN Phone Number: 04/16/2023, 11:21 AM    Transition of Care Department [facility]) has reviewed patient and no TOC needs have been identified at this time. We will continue to monitor patient advancement through interdisciplinary progression rounds. If new patient transition needs arise, please place a TOC consult.

## 2023-04-16 NOTE — Progress Notes (Signed)
Patient alert and oriented, working towards pain control. Patient is working towards tolerating fluids, advanced to protein shake today. Staff reported pt had vomiting episode and been experiencing nausea. Reminded pt to sip slowly and encouraged more standing along with ambulation to help with gravity.    Reviewed Gastric sleeve discharge instructions with patient and patient is able to articulate understanding. Provided information on BELT program, Support Group and [facility] outpatient pharmacy. Communicated general update of patient status to surgeon. All questions addressed. Will continue to partner with bedside RN and follow up with patient per protocol.  24hr fluid recall is mL 450 mL Per hydration protocol, bariatric nurse coordinator to make follow-up phone call within one week.   Thank you,  Lubertha Basque, RN, MSN Bariatric Nurse Coordinator [phone] (office)

## 2023-04-16 NOTE — Discharge Summary (Signed)
Patient ID: Heather Mckinney [id number] 52 y.o. [birth year]  04/15/2023  Discharge date and time: 04/17/2023  Admitting Physician: Hyman Hopes Sharay Bellissimo  Discharge Physician: Hyman Hopes Dae Highley  Admission Diagnoses: Morbid obesity with BMI of 45.0-49.9, adult [E66.01, Z68.42] Patient Active Problem List   Diagnosis Date Noted   Morbid obesity with BMI of 45.0-49.9, adult 04/15/2023   Snoring 02/23/2020   Intractable episodic cluster headache 01/17/2020   Sleep related headaches 01/17/2020   Class 3 severe obesity due to excess calories with serious comorbidity and body mass index (BMI) of 40.0 to 44.9 in adult 01/17/2020   Obesity with alveolar hypoventilation and body mass index (BMI) of 40 or greater 01/17/2020   Pulmonary emphysema 01/17/2020   Chronic migraine without aura, with intractable migraine, so stated, with status migrainosus 01/03/2020   Migraine with aura and with status migrainosus, not intractable 01/03/2020     Discharge Diagnoses Patient Active Problem List   Diagnosis Date Noted   Morbid obesity with BMI of 45.0-49.9, adult 04/15/2023   Snoring 02/23/2020   Intractable episodic cluster headache 01/17/2020   Sleep related headaches 01/17/2020   Class 3 severe obesity due to excess calories with serious comorbidity and body mass index (BMI) of 40.0 to 44.9 in adult 01/17/2020   Obesity with alveolar hypoventilation and body mass index (BMI) of 40 or greater 01/17/2020   Pulmonary emphysema 01/17/2020   Chronic migraine without aura, with intractable migraine, so stated, with status migrainosus 01/03/2020   Migraine with aura and with status migrainosus, not intractable 01/03/2020    Operations: Procedure(s): XI ROBOT ASSISTED ROUX-EN-Y WITH UPPER ENDO HERNIA REPAIR HIATAL  Admission Condition: Good  Discharged Condition: good  Indication for Admission: Obesity, hiatal hernia  Hospital Course: Ms. Gravelle underwent robotic gastric bypass with hiatal  hernia repair on 04/15/23.   Consults: None  Significant Diagnostic Studies: None  Treatments: surgery: as above  Disposition: Home  Patient Instructions:  Allergies as of 04/17/2023       Reactions   Ephedrine Other (See Comments)   Patient states she went into cardiac arrest after taking sudafed.   Aspirin Other (See Comments)   Hearing loss         Medication List     STOP taking these medications    Ajovy 225 MG/1.5ML Sosy Generic drug: Fremanezumab-vfrm   diclofenac 75 MG EC tablet Commonly known as: VOLTAREN   naproxen sodium 220 MG tablet Commonly known as: ALEVE   traMADol 50 MG tablet Commonly known as: ULTRAM   Ubrelvy 100 MG Tabs Generic drug: Ubrogepant       TAKE these medications    acetaminophen 500 MG tablet Commonly known as: TYLENOL Take 2 tablets (1,000 mg total) by mouth every 8 (eight) hours for 5 days.   B-12 2500 MCG Tabs Take 2,500 mcg by mouth daily.   Dialyvite Vitamin D 5000 125 MCG (5000 UT) capsule Generic drug: Cholecalciferol Take 5,000 Units by mouth daily.   DULoxetine 20 MG capsule Commonly known as: CYMBALTA Take 20 mg by mouth daily.   gabapentin 300 MG capsule Commonly known as: NEURONTIN Take 300 mg by mouth 2 (two) times daily.   magnesium oxide 400 (240 Mg) MG tablet Commonly known as: MAG-OX Take 400 mg by mouth at bedtime.   methocarbamol 750 MG tablet Commonly known as: Robaxin-750 Take 1 tablet (750 mg total) by mouth every 6 (six) hours as needed for muscle spasms.   ondansetron 4 MG disintegrating tablet Commonly known as: ZOFRAN-ODT Take  1 tablet (4 mg total) by mouth every 6 (six) hours as needed for nausea or vomiting.   oxyCODONE 5 MG immediate release tablet Commonly known as: Oxy IR/ROXICODONE Take 1 tablet (5 mg total) by mouth every 6 (six) hours as needed for severe pain.   pantoprazole 40 MG tablet Commonly known as: PROTONIX Take 1 tablet (40 mg total) by mouth daily.         Activity: no heavy lifting for 4 weeks Diet: Bariatric diet protocol Wound Care: keep wound clean and dry  Follow-up:  With Dr. Dossie Der  Signed: Hyman Hopes Hisako Bugh [facility], Bariatric, & Minimally Invasive [facility] [place]   04/17/2023, 8:39 AM

## 2023-04-17 LAB — CBC WITH DIFFERENTIAL/PLATELET
Abs Immature Granulocytes: 0.04 10*3/uL (ref 0.00–0.07)
Basophils Absolute: 0 10*3/uL (ref 0.0–0.1)
Basophils Relative: 1 %
Eosinophils Absolute: 0.1 10*3/uL (ref 0.0–0.5)
Eosinophils Relative: 1 %
HCT: 37.1 % (ref 36.0–46.0)
Hemoglobin: 11.9 g/dL — ABNORMAL LOW (ref 12.0–15.0)
Immature Granulocytes: 1 %
Lymphocytes Relative: 22 %
Lymphs Abs: 1.8 10*3/uL (ref 0.7–4.0)
MCH: 30.4 pg (ref 26.0–34.0)
MCHC: 32.1 g/dL (ref 30.0–36.0)
MCV: 94.6 fL (ref 80.0–100.0)
Monocytes Absolute: 0.7 10*3/uL (ref 0.1–1.0)
Monocytes Relative: 9 %
Neutro Abs: 5.4 10*3/uL (ref 1.7–7.7)
Neutrophils Relative %: 66 %
Platelets: 240 10*3/uL (ref 150–400)
RBC: 3.92 MIL/uL (ref 3.87–5.11)
RDW: 12.4 % (ref 11.5–15.5)
WBC: 8 10*3/uL (ref 4.0–10.5)
nRBC: 0 % (ref 0.0–0.2)

## 2023-04-17 NOTE — Progress Notes (Signed)
Discharge instructions discussed with patient and family, verbalized agreement and understanding 

## 2023-04-25 ENCOUNTER — Telehealth (HOSPITAL_COMMUNITY): Payer: Self-pay | Admitting: *Deleted

## 2023-04-25 NOTE — Telephone Encounter (Signed)
1. Tell me about your pain and pain management?     Pt denies any pain.     2. Let's talk about fluid intake. How much total fluid are you taking in?   Pt states that s/he is getting in at least 64oz of fluid including protein shakes, bottled water, and broth   3. How much protein have you taken in the last day?     Pt states she is meeting the goal of 60g of protein each day with the ensure protein shakes    4. Have you had nausea? Tell me about when you have experienced nausea and what you did to help?   Pt denies nausea.   5. Tell me what your incisions look like?   "Incisions look fine". Noted that two of them looked a little "red", but it has improved since the day before and it does not hurt. Pt denies a fever, chills. Pt states incisions are not swollen, open, or draining. Pt encouraged to call [facility] if incisions change.   6. Have you been passing gas? BM?   Pt states that they are having BMs.  7. If a problem or question were to arise who would you call? Do you know contact numbers for [facility], [facility], and [facility]?   Pt knows to call [facility] for surgical, [facility] for nutrition, and [facility] for non-urgent questions or concerns. Pt denies dehydration symptoms. Pt can describe s/sx of dehydration.   8. How has the walking going?   Pt states she is walking around and able to be active without difficulty.   9. How are your vitamins and calcium going? How are you taking them?    Pt states that s/he is taking his/her supplements and vitamins without difficulty.  Pt stated" The vitamins do not taste the best, but they are being consumed, can't wait until the 30 days are done to get to the pills"

## 2023-04-29 ENCOUNTER — Encounter: Payer: BC Managed Care – PPO | Admitting: Dietician

## 2023-04-29 ENCOUNTER — Encounter: Payer: Self-pay | Admitting: Dietician

## 2023-04-29 VITALS — Ht 68.0 in | Wt 287.6 lb

## 2023-04-29 DIAGNOSIS — E669 Obesity, unspecified: Secondary | ICD-10-CM

## 2023-04-29 NOTE — Progress Notes (Signed)
2 Week Post-Operative Nutrition Class   Patient was seen on 04/29/2023 for Post-Operative Nutrition education at the [facility].    Surgery date: 04/15/2023 Surgery type: RYGB  Anthropometrics  Start weight at [facility]: 317.8 lbs (date: 01/27/2023)  Height: 68 in Weight today: 287.6 lbs. BMI: 43.73 kg/m2     Clinical  Medical hx: Asthma, COPD, obesity, fibromyalgia Medications: diclofenac, duloxetine HCL, magnesium oxide, gabapentin, vit d, bit b12  Labs: LDL 117; Vit D 12.1 Notable signs/symptoms: none noted Any previous deficiencies? No Bowel Habits: Every day to every other day no complaints   Body Composition Scale 04/29/2023  Current Body Weight 287.6  Total Body Fat % 46.9  Visceral Fat 18  Fat-Free Mass % 53.0   Total Body Water % 41.0  Muscle-Mass lbs 34.2  BMI 43.7  Body Fat Displacement          Torso  lbs 83.8         Left Leg  lbs 16.7         Right Leg  lbs 16.7         Left Arm  lbs 8.3         Right Arm  lbs 8.3      The following the learning objectives were met by the patient during this course: Identifies Soft Prepped Plan Advancement Guide  Identifies Soft, High Proteins (Phase 1), beginning 2 weeks post-operatively to 3 weeks post-operatively Identifies Additional Soft High Proteins, soft non-starchy vegetables, fruits and starches (Phase 2), beginning 3 weeks post-operatively to 3 months post-operatively Identifies appropriate sources of fluids, proteins, vegetables, fruits and starches Identifies appropriate fat sources and healthy verses unhealthy fat types   States protein, vegetable, fruit and starch recommendations and appropriate sources post-operatively Identifies the need for appropriate texture modifications, mastication, and bite sizes when consuming solids Identifies appropriate fat consumption and sources Identifies appropriate multivitamin and calcium sources post-operatively Describes the need for physical  activity post-operatively and will follow MD recommendations States when to call healthcare provider regarding medication questions or post-operative complications   Handouts given during class include: Soft Prepped Plan Advancement Guide   Follow-Up Plan: Patient will follow-up at [facility] in 10 weeks for 3 month post-op nutrition visit for diet advancement per MD.

## 2023-05-08 ENCOUNTER — Telehealth: Payer: Self-pay | Admitting: Dietician

## 2023-05-08 NOTE — Telephone Encounter (Signed)
RD called pt to verify fluid intake once starting soft, solid proteins 2 week post-bariatric surgery.   Daily Fluid intake: Daily Protein intake: Bowel Habits:   Concerns/issues:    Left Voice Message 

## 2023-05-29 ENCOUNTER — Emergency Department (HOSPITAL_COMMUNITY)
Admission: EM | Admit: 2023-05-29 | Discharge: 2023-05-29 | Disposition: A | Discharge status: Home / Self Care | Payer: BC Managed Care – PPO | Attending: Emergency Medicine | Admitting: Emergency Medicine

## 2023-05-29 ENCOUNTER — Encounter (HOSPITAL_COMMUNITY): Payer: Self-pay | Admitting: *Deleted

## 2023-05-29 ENCOUNTER — Other Ambulatory Visit: Payer: Self-pay

## 2023-05-29 ENCOUNTER — Emergency Department (HOSPITAL_COMMUNITY): Payer: BC Managed Care – PPO

## 2023-05-29 DIAGNOSIS — J449 Chronic obstructive pulmonary disease, unspecified: Secondary | ICD-10-CM | POA: Diagnosis not present

## 2023-05-29 DIAGNOSIS — R1111 Vomiting without nausea: Secondary | ICD-10-CM | POA: Insufficient documentation

## 2023-05-29 DIAGNOSIS — Z7951 Long term (current) use of inhaled steroids: Secondary | ICD-10-CM | POA: Diagnosis not present

## 2023-05-29 DIAGNOSIS — J45909 Unspecified asthma, uncomplicated: Secondary | ICD-10-CM | POA: Insufficient documentation

## 2023-05-29 DIAGNOSIS — R051 Acute cough: Secondary | ICD-10-CM | POA: Insufficient documentation

## 2023-05-29 DIAGNOSIS — R059 Cough, unspecified: Secondary | ICD-10-CM | POA: Diagnosis present

## 2023-05-29 LAB — CBC WITH DIFFERENTIAL/PLATELET
Abs Immature Granulocytes: 0.04 10*3/uL (ref 0.00–0.07)
Basophils Absolute: 0 10*3/uL (ref 0.0–0.1)
Basophils Relative: 1 %
Eosinophils Absolute: 0.3 10*3/uL (ref 0.0–0.5)
Eosinophils Relative: 4 %
HCT: 40.5 % (ref 36.0–46.0)
Hemoglobin: 13.2 g/dL (ref 12.0–15.0)
Immature Granulocytes: 1 %
Lymphocytes Relative: 24 %
Lymphs Abs: 2 10*3/uL (ref 0.7–4.0)
MCH: 30 pg (ref 26.0–34.0)
MCHC: 32.6 g/dL (ref 30.0–36.0)
MCV: 92 fL (ref 80.0–100.0)
Monocytes Absolute: 0.4 10*3/uL (ref 0.1–1.0)
Monocytes Relative: 6 %
Neutro Abs: 5.2 10*3/uL (ref 1.7–7.7)
Neutrophils Relative %: 64 %
Platelets: 313 10*3/uL (ref 150–400)
RBC: 4.4 MIL/uL (ref 3.87–5.11)
RDW: 13 % (ref 11.5–15.5)
WBC: 8 10*3/uL (ref 4.0–10.5)
nRBC: 0 % (ref 0.0–0.2)

## 2023-05-29 LAB — COMPREHENSIVE METABOLIC PANEL
ALT: 32 U/L (ref 0–44)
AST: 30 U/L (ref 15–41)
Albumin: 4.4 g/dL (ref 3.5–5.0)
Alkaline Phosphatase: 93 U/L (ref 38–126)
Anion gap: 10 (ref 5–15)
BUN: 21 mg/dL — ABNORMAL HIGH (ref 6–20)
CO2: 23 mmol/L (ref 22–32)
Calcium: 9.6 mg/dL (ref 8.9–10.3)
Chloride: 104 mmol/L (ref 98–111)
Creatinine, Ser: 0.61 mg/dL (ref 0.44–1.00)
GFR, Estimated: >60 mL/min (ref >60)
Glucose, Bld: 95 mg/dL (ref 70–99)
Potassium: 3.2 mmol/L — ABNORMAL LOW (ref 3.5–5.1)
Sodium: 137 mmol/L (ref 135–145)
Total Bilirubin: 0.7 mg/dL (ref 0.3–1.2)
Total Protein: 7.7 g/dL (ref 6.5–8.1)

## 2023-05-29 LAB — LIPASE, BLOOD: Lipase: 23 U/L (ref 11–51)

## 2023-05-29 MED ORDER — SODIUM CHLORIDE 0.9 % IV BOLUS
500.0000 mL | Freq: Once | INTRAVENOUS | Status: AC
  Administered 2023-05-29: 500 mL via INTRAVENOUS

## 2023-05-29 MED ORDER — ONDANSETRON HCL 4 MG/2ML IJ SOLN
4.0000 mg | Freq: Once | INTRAMUSCULAR | Status: AC
  Administered 2023-05-29: 4 mg via INTRAVENOUS
  Filled 2023-05-29: qty 2

## 2023-05-29 MED ORDER — ALBUTEROL SULFATE HFA 108 (90 BASE) MCG/ACT IN AERS: 1.0000 | INHALATION_SPRAY | Freq: Four times a day (QID) | RESPIRATORY_TRACT | 0 refills | Status: DC | PRN

## 2023-05-29 MED ORDER — POTASSIUM CHLORIDE CRYS ER 20 MEQ PO TBCR
40.0000 meq | EXTENDED_RELEASE_TABLET | Freq: Once | ORAL | Status: AC
  Administered 2023-05-29: 40 meq via ORAL
  Filled 2023-05-29: qty 2

## 2023-05-29 MED ORDER — FLUTICASONE-SALMETEROL 45-21 MCG/ACT IN AERO: 2.0000 | INHALATION_SPRAY | Freq: Two times a day (BID) | RESPIRATORY_TRACT | 0 refills | Status: DC

## 2023-05-29 MED ORDER — IPRATROPIUM-ALBUTEROL 0.5-2.5 (3) MG/3ML IN SOLN
3.0000 mL | Freq: Once | RESPIRATORY_TRACT | Status: AC
  Administered 2023-05-29: 3 mL via RESPIRATORY_TRACT
  Filled 2023-05-29: qty 3

## 2023-05-29 MED ORDER — IPRATROPIUM-ALBUTEROL 0.5-2.5 (3) MG/3ML IN SOLN
6.0000 mL | Freq: Once | RESPIRATORY_TRACT | Status: AC
  Administered 2023-05-29: 6 mL via RESPIRATORY_TRACT
  Filled 2023-05-29: qty 6

## 2023-05-29 MED ORDER — METHYLPREDNISOLONE SODIUM SUCC 125 MG IJ SOLR
125.0000 mg | Freq: Once | INTRAMUSCULAR | Status: AC
  Administered 2023-05-29: 125 mg via INTRAVENOUS
  Filled 2023-05-29: qty 2

## 2023-05-29 MED ORDER — FLUTICASONE PROPIONATE HFA 44 MCG/ACT IN AERO: 1.0000 | INHALATION_SPRAY | Freq: Every day | RESPIRATORY_TRACT | 0 refills | Status: DC

## 2023-05-29 NOTE — Discharge Instructions (Signed)
You have been seen today for your complaint of cough. Your lab work showed low potassium but was otherwise reassuring. Your imaging showed no new abnormalities. Your discharge medications include fluticasone inhaler. This is a steroid inhaler. Take it daily for your cough as you cannot take steroid pills. I have also sent you a prescription for an albuterol inhaler. Take this as needed for shortness of breath. Follow up with: your primary care doctor within the next week. Please seek immediate medical care if you develop any of the following symptoms: You cough up blood. You have trouble breathing. Your heart is beating very fast. At this time there does not appear to be the presence of an emergent medical condition, however there is always the potential for conditions to change. Please read and follow the below instructions.  Do not take your medicine if  develop an itchy rash, swelling in your mouth or lips, or difficulty breathing; call 911 and seek immediate emergency medical attention if this occurs.  You may review your lab tests and imaging results in their entirety on your MyChart account.  Please discuss all results of fully with your primary care provider and other specialist at your follow-up visit.  Note: Portions of this text may have been transcribed using voice recognition software. Every effort was made to ensure accuracy; however, inadvertent computerized transcription errors may still be present.

## 2023-05-29 NOTE — ED Triage Notes (Addendum)
Pt with emesis with trying to drink anything.  Pt states she has bronchitis, cough. Pt with recent gastric bypass 8 weeks ago. Denies nausea at present

## 2023-05-29 NOTE — ED Provider Notes (Signed)
[place] [facility]ERGENCY DEPARTMENT AT [facility] Provider Note   CSN: [id number] Arrival date & time: 05/29/23  1346     History  Chief Complaint  Patient presents with   Emesis    Heather Mckinney is a 52 y.o. female.  With history of COPD, anxiety, depression, asthma, fibromyalgia who presents to the ED for evaluation of nausea and cough.  She had gastric bypass surgery on 04/15/2023 by Dr. Dossie Der.  She reported no issues until this morning.  She has had inability to tolerate anything by mouth for the entire day.  She has tried small sips of her protein shakes which she vomited up shortly afterwards.  She does the same with water.  She denies any preemesis nausea.  No abdominal pain.  Believes her symptoms may be secondary to her cough.  She has had a cough for the past 2 days.  This is nonproductive.  She states she gets bronchitis twice per year and believes this is the cause of her cough today.  She also reports wheezing.  She has not taken anything for symptoms at home.  She denies fevers or chills, chest pain, hemoptysis.  No abdominal pain.  Minimal shortness of breath.   Emesis Associated symptoms: cough        Home Medications Prior to Admission medications   Medication Sig Start Date End Date Taking? Authorizing Provider  albuterol (VENTOLIN HFA) 108 (90 Base) MCG/ACT inhaler Inhale 1-2 puffs into the lungs every 6 (six) hours as needed for wheezing or shortness of breath. 05/29/23  Yes Sherelle Castelli, Edsel Petrin, PA-C  calcium carbonate (OS-CAL) 1250 (500 Ca) MG chewable tablet Chew 1 tablet by mouth daily.   Yes [provider]  diclofenac (VOLTAREN) 75 MG EC tablet Take 75 mg by mouth 2 (two) times daily. 05/07/23  Yes [provider]  DULoxetine (CYMBALTA) 20 MG capsule Take 20 mg by mouth daily. 11/13/22  Yes [provider]  fluticasone (FLOVENT HFA) 44 MCG/ACT inhaler Inhale 1 puff into the lungs daily. 05/29/23  Yes Johnryan Sao, Edsel Petrin,  PA-C  gabapentin (NEURONTIN) 300 MG capsule Take 300 mg by mouth 2 (two) times daily.   Yes [provider]  methocarbamol (ROBAXIN-750) 750 MG tablet Take 1 tablet (750 mg total) by mouth every 6 (six) hours as needed for muscle spasms. 04/16/23  Yes Stechschulte, Hyman Hopes, MD  Multiple Vitamin (MULTIVITAMIN) capsule Take 1 capsule by mouth daily.   Yes [provider]  ondansetron (ZOFRAN-ODT) 4 MG disintegrating tablet Take 1 tablet (4 mg total) by mouth every 6 (six) hours as needed for nausea or vomiting. 04/16/23  Yes Stechschulte, Hyman Hopes, MD  pantoprazole (PROTONIX) 40 MG tablet Take 1 tablet (40 mg total) by mouth daily. 04/16/23  Yes Stechschulte, Hyman Hopes, MD  oxyCODONE (OXY IR/ROXICODONE) 5 MG immediate release tablet Take 1 tablet (5 mg total) by mouth every 6 (six) hours as needed for severe pain. Patient not taking: Reported on 05/29/2023 04/16/23   Stechschulte, Hyman Hopes, MD      Allergies    Ephedrine and Aspirin    Review of Systems   Review of Systems  Respiratory:  Positive for cough.   Gastrointestinal:  Positive for vomiting.  All other systems reviewed and are negative.   Physical Exam Updated Vital Signs BP 139/88   Pulse 85   Temp 98 F (36.7 C) (Oral)   Resp 16   Ht 5\' 8"  (1.727 m)   Wt 125.6 kg  LMP 12/25/2019   SpO2 99%   BMI 42.12 kg/m  Physical Exam Vitals and nursing note reviewed.  Constitutional:      General: She is not in acute distress.    Appearance: Normal appearance. She is well-developed. She is not ill-appearing, toxic-appearing or diaphoretic.     Comments: Resting comfortably in bed  HENT:     Head: Normocephalic and atraumatic.  Eyes:     Conjunctiva/sclera: Conjunctivae normal.  Cardiovascular:     Rate and Rhythm: Normal rate and regular rhythm.     Heart sounds: No murmur heard. Pulmonary:     Effort: Pulmonary effort is normal. No respiratory distress.     Breath sounds: Wheezing (Inspiratory and expiratory)  present.  Abdominal:     Palpations: Abdomen is soft.     Tenderness: There is no abdominal tenderness. There is no guarding.  Musculoskeletal:        General: No swelling.     Cervical back: Neck supple.  Skin:    General: Skin is warm and dry.     Capillary Refill: Capillary refill takes less than 2 seconds.  Neurological:     General: No focal deficit present.     Mental Status: She is alert and oriented to person, place, and time.  Psychiatric:        Mood and Affect: Mood normal.        Behavior: Behavior normal.     ED Results / Procedures / Treatments   Labs (all labs ordered are listed, but only abnormal results are displayed) Labs Reviewed  COMPREHENSIVE METABOLIC PANEL - Abnormal; Notable for the following components:      Result Value   Potassium 3.2 (*)    BUN 21 (*)    All other components within normal limits  CBC WITH DIFFERENTIAL/PLATELET  LIPASE, BLOOD    EKG None  Radiology DG Chest 2 View  Result Date: 05/29/2023 CLINICAL DATA:  Cough and wheezing for 2 days. Vomiting today. Status post gastric bypass 03/2023. EXAM: CHEST - 2 VIEW COMPARISON:  Chest radiographs 01/27/2023 and 02/23/2019 FINDINGS: Cardiac silhouette and mediastinal contours are within normal limits. Mild right-greater-than-left lower lung interstitial thickening appears unchanged from multiple prior radiographs, likely chronic scarring. No acute airspace opacity is seen. No pleural effusion pneumothorax. Mild-to-moderate multilevel degenerative disc changes of the thoracic spine. Cholecystectomy clips. IMPRESSION: Mild right-greater-than-left lower lung interstitial thickening appears unchanged from multiple prior radiographs, likely chronic scarring. No acute lung process. Electronically Signed   By: Neita Garnet M.D.   On: 05/29/2023 15:04    Procedures Procedures    Medications Ordered in ED Medications  sodium chloride 0.9 % bolus 500 mL (500 mLs Intravenous New Bag/Given 05/29/23  1504)  ondansetron (ZOFRAN) injection 4 mg (4 mg Intravenous Given 05/29/23 1506)  ipratropium-albuterol (DUONEB) 0.5-2.5 (3) MG/3ML nebulizer solution 3 mL (3 mLs Nebulization Given 05/29/23 1515)  methylPREDNISolone sodium succinate (SOLU-MEDROL) 125 mg/2 mL injection 125 mg (125 mg Intravenous Given 05/29/23 1507)  ipratropium-albuterol (DUONEB) 0.5-2.5 (3) MG/3ML nebulizer solution 6 mL (6 mLs Nebulization Given 05/29/23 1605)  potassium chloride SA (KLOR-CON M) CR tablet 40 mEq (40 mEq Oral Given 05/29/23 1804)    ED Course/ Medical Decision Making/ A&P                             Medical Decision Making Amount and/or Complexity of Data Reviewed Labs: ordered. Radiology: ordered.  Risk Prescription drug management.  This patient presents to the ED for concern of cough, vomiting, this involves an extensive number of treatment options, and is a complaint that carries with it a high risk of complications and morbidity.  Differential diagnosis for emergent cause of cough includes but is not limited to upper respiratory infection, lower respiratory infection, allergies, asthma, irritants, foreign body, medications such as ACE inhibitors, reflux, asthma, CHF, lung cancer, interstitial lung disease, psychiatric causes, postnasal drip and postinfectious bronchospasm.   The emergent differential diagnosis for vomiting includes, but is not limited to ACS/MI, DKA, Ischemic bowel, Meningitis, Sepsis, Acute gastric dilation, Adrenal insufficiency, Appendicitis,  Bowel obstruction/ileus, Carbon monoxide poisoning, Cholecystitis, Electrolyte abnormalities, Elevated ICP, Gastric outlet obstruction, Pancreatitis, Ruptured viscus, Biliary colic, Cannabinoid hyperemesis syndrome, Gastritis, Gastroenteritis, Gastroparesis,  Narcotic withdrawal, Peptic ulcer disease, and UTI   Co morbidities that complicate the patient evaluation  COPD, anxiety, depression, asthma, fibromyalgia  My initial workup includes  labs, symptoms control, imaging  Additional history obtained from: Nursing notes from this visit.  I ordered, reviewed and interpreted labs which include: CBC, CMP, lipase.Marland Kitchen  Hyponatremia of 3.2.  Labs otherwise reassuring  I ordered imaging studies including Chest X ray I independently visualized and interpreted imaging which showed no acute changes I agree with the radiologist interpretation  Afebrile, hemodynamically stable.  52 year old female presents ED for evaluation of emesis after p.o. intake.  Believes this is secondary to the cough that she has had for 2 days.  She believes she has a bronchitis flare as she generally gets a bronchitis flare twice per year.  Patient recently underwent a Roux-en-Y procedure, approximately 8 weeks ago.  She denies a sensation of nausea prior to her emesis episodes.  On arrival, she appears very well.  She does have significant inspiratory and expiratory wheezing but she is without labored breathing, tachypnea or hypoxia.  She received 3 DuoNeb treatments in the ED and had significant improvement in her wheezing.  She only had minimal shortness of breath.  main complaint is a cough.  She also received IV steroids while in the ED.  Given her recent Roux-en-Y, will defer p.o. steroids and is to begin patient on an inhaled corticosteroid.  Patient was able to tolerate p.o. intake in the ED without difficulty.  States she has Zofran at home.  She was given strict return precautions.  Stable at discharge.  At this time there does not appear to be any evidence of an acute emergency medical condition and the patient appears stable for discharge with appropriate outpatient follow up. Diagnosis was discussed with patient who verbalizes understanding of care plan and is agreeable to discharge. I have discussed return precautions with patient and significant other who verbalizes understanding. Patient encouraged to follow-up with their PCP within 1 week. All questions  answered.  Patient's case discussed with Dr. Particia Nearing who agrees with plan to discharge with follow-up.   Note: Portions of this report may have been transcribed using voice recognition software. Every effort was made to ensure accuracy; however, inadvertent computerized transcription errors may still be present.        Final Clinical Impression(s) / ED Diagnoses Final diagnoses:  Acute cough  Vomiting without nausea, unspecified vomiting type    Rx / DC Orders ED Discharge Orders          Ordered    fluticasone (FLOVENT HFA) 44 MCG/ACT inhaler  Daily        05/29/23 1804    albuterol (VENTOLIN HFA) 108 (90 Base) MCG/ACT inhaler  Every 6 hours PRN        05/29/23 1804              Mora Bellman 05/29/23 1815    Bethann [place], MD 06/01/23 1137

## 2023-06-06 ENCOUNTER — Other Ambulatory Visit: Payer: Self-pay

## 2023-06-06 ENCOUNTER — Emergency Department (HOSPITAL_COMMUNITY): Payer: BC Managed Care – PPO

## 2023-06-06 ENCOUNTER — Inpatient Hospital Stay (HOSPITAL_COMMUNITY)
Admission: EM | Admit: 2023-06-06 | Discharge: 2023-06-09 | DRG: 190 | Disposition: A | Discharge status: Home / Self Care | Payer: BC Managed Care – PPO | Attending: Family Medicine | Admitting: Family Medicine

## 2023-06-06 ENCOUNTER — Encounter (HOSPITAL_COMMUNITY): Payer: Self-pay | Admitting: *Deleted

## 2023-06-06 DIAGNOSIS — Z833 Family history of diabetes mellitus: Secondary | ICD-10-CM | POA: Diagnosis not present

## 2023-06-06 DIAGNOSIS — J441 Chronic obstructive pulmonary disease with (acute) exacerbation: Secondary | ICD-10-CM | POA: Diagnosis not present

## 2023-06-06 DIAGNOSIS — K219 Gastro-esophageal reflux disease without esophagitis: Secondary | ICD-10-CM | POA: Diagnosis present

## 2023-06-06 DIAGNOSIS — J439 Emphysema, unspecified: Secondary | ICD-10-CM | POA: Diagnosis present

## 2023-06-06 DIAGNOSIS — Z886 Allergy status to analgesic agent status: Secondary | ICD-10-CM | POA: Diagnosis not present

## 2023-06-06 DIAGNOSIS — Z7722 Contact with and (suspected) exposure to environmental tobacco smoke (acute) (chronic): Secondary | ICD-10-CM | POA: Diagnosis present

## 2023-06-06 DIAGNOSIS — Z825 Family history of asthma and other chronic lower respiratory diseases: Secondary | ICD-10-CM | POA: Diagnosis not present

## 2023-06-06 DIAGNOSIS — Z1152 Encounter for screening for COVID-19: Secondary | ICD-10-CM | POA: Diagnosis not present

## 2023-06-06 DIAGNOSIS — Z7951 Long term (current) use of inhaled steroids: Secondary | ICD-10-CM

## 2023-06-06 DIAGNOSIS — F410 Panic disorder [episodic paroxysmal anxiety] without agoraphobia: Secondary | ICD-10-CM | POA: Diagnosis present

## 2023-06-06 DIAGNOSIS — Z8489 Family history of other specified conditions: Secondary | ICD-10-CM

## 2023-06-06 DIAGNOSIS — J45901 Unspecified asthma with (acute) exacerbation: Secondary | ICD-10-CM | POA: Diagnosis present

## 2023-06-06 DIAGNOSIS — Z821 Family history of blindness and visual loss: Secondary | ICD-10-CM

## 2023-06-06 DIAGNOSIS — M797 Fibromyalgia: Secondary | ICD-10-CM | POA: Diagnosis present

## 2023-06-06 DIAGNOSIS — F32A Depression, unspecified: Secondary | ICD-10-CM | POA: Diagnosis present

## 2023-06-06 DIAGNOSIS — Z6841 Body Mass Index (BMI) 40.0 and over, adult: Secondary | ICD-10-CM

## 2023-06-06 DIAGNOSIS — Z9884 Bariatric surgery status: Secondary | ICD-10-CM

## 2023-06-06 DIAGNOSIS — Z888 Allergy status to other drugs, medicaments and biological substances status: Secondary | ICD-10-CM

## 2023-06-06 DIAGNOSIS — J4521 Mild intermittent asthma with (acute) exacerbation: Secondary | ICD-10-CM

## 2023-06-06 DIAGNOSIS — J9601 Acute respiratory failure with hypoxia: Secondary | ICD-10-CM | POA: Diagnosis present

## 2023-06-06 DIAGNOSIS — Z79899 Other long term (current) drug therapy: Secondary | ICD-10-CM

## 2023-06-06 DIAGNOSIS — Z8249 Family history of ischemic heart disease and other diseases of the circulatory system: Secondary | ICD-10-CM

## 2023-06-06 DIAGNOSIS — R519 Headache, unspecified: Secondary | ICD-10-CM | POA: Diagnosis present

## 2023-06-06 LAB — BASIC METABOLIC PANEL
Anion gap: 7 (ref 5–15)
BUN: 23 mg/dL — ABNORMAL HIGH (ref 6–20)
CO2: 25 mmol/L (ref 22–32)
Calcium: 9.2 mg/dL (ref 8.9–10.3)
Chloride: 106 mmol/L (ref 98–111)
Creatinine, Ser: 0.51 mg/dL (ref 0.44–1.00)
GFR, Estimated: >60 mL/min (ref >60)
Glucose, Bld: 123 mg/dL — ABNORMAL HIGH (ref 70–99)
Potassium: 3.8 mmol/L (ref 3.5–5.1)
Sodium: 138 mmol/L (ref 135–145)

## 2023-06-06 LAB — CBC WITH DIFFERENTIAL/PLATELET
Abs Immature Granulocytes: 0.03 10*3/uL (ref 0.00–0.07)
Basophils Absolute: 0 10*3/uL (ref 0.0–0.1)
Basophils Relative: 0 %
Eosinophils Absolute: 0.3 10*3/uL (ref 0.0–0.5)
Eosinophils Relative: 2 %
HCT: 39.5 % (ref 36.0–46.0)
Hemoglobin: 12.7 g/dL (ref 12.0–15.0)
Immature Granulocytes: 0 %
Lymphocytes Relative: 11 %
Lymphs Abs: 1.2 10*3/uL (ref 0.7–4.0)
MCH: 30.4 pg (ref 26.0–34.0)
MCHC: 32.2 g/dL (ref 30.0–36.0)
MCV: 94.5 fL (ref 80.0–100.0)
Monocytes Absolute: 0.5 10*3/uL (ref 0.1–1.0)
Monocytes Relative: 5 %
Neutro Abs: 8.5 10*3/uL — ABNORMAL HIGH (ref 1.7–7.7)
Neutrophils Relative %: 82 %
Platelets: 290 10*3/uL (ref 150–400)
RBC: 4.18 MIL/uL (ref 3.87–5.11)
RDW: 13.2 % (ref 11.5–15.5)
WBC: 10.5 10*3/uL (ref 4.0–10.5)
nRBC: 0 % (ref 0.0–0.2)

## 2023-06-06 LAB — SARS CORONAVIRUS 2 BY RT PCR: SARS Coronavirus 2 by RT PCR: NEGATIVE

## 2023-06-06 MED ORDER — ACETAMINOPHEN 325 MG PO TABS: 650.0000 mg | ORAL_TABLET | Freq: Four times a day (QID) | ORAL | Status: DC | PRN

## 2023-06-06 MED ORDER — MAGNESIUM SULFATE 2 GM/50ML IV SOLN
2.0000 g | Freq: Once | INTRAVENOUS | Status: AC
  Administered 2023-06-06: 2 g via INTRAVENOUS
  Filled 2023-06-06: qty 50

## 2023-06-06 MED ORDER — ALBUTEROL SULFATE (2.5 MG/3ML) 0.083% IN NEBU
INHALATION_SOLUTION | RESPIRATORY_TRACT | Status: AC
  Administered 2023-06-06: 2.5 mg
  Filled 2023-06-06: qty 3

## 2023-06-06 MED ORDER — ALBUTEROL SULFATE (2.5 MG/3ML) 0.083% IN NEBU
10.0000 mg/h | INHALATION_SOLUTION | Freq: Once | RESPIRATORY_TRACT | Status: AC
  Filled 2023-06-06: qty 21

## 2023-06-06 MED ORDER — ALPRAZOLAM 0.5 MG PO TABS
0.5000 mg | ORAL_TABLET | Freq: Three times a day (TID) | ORAL | Status: DC | PRN
  Administered 2023-06-06 – 2023-06-07 (×2): 0.5 mg via ORAL
  Filled 2023-06-06 (×2): qty 1

## 2023-06-06 MED ORDER — BISACODYL 5 MG PO TBEC: 5.0000 mg | DELAYED_RELEASE_TABLET | Freq: Every day | ORAL | Status: DC | PRN

## 2023-06-06 MED ORDER — TRAZODONE HCL 50 MG PO TABS
100.0000 mg | ORAL_TABLET | Freq: Every evening | ORAL | Status: DC | PRN
  Administered 2023-06-07 – 2023-06-08 (×2): 100 mg via ORAL
  Filled 2023-06-06 (×2): qty 2

## 2023-06-06 MED ORDER — DOXYCYCLINE HYCLATE 100 MG PO TABS
100.0000 mg | ORAL_TABLET | Freq: Two times a day (BID) | ORAL | Status: DC
  Administered 2023-06-06 – 2023-06-09 (×6): 100 mg via ORAL
  Filled 2023-06-06 (×6): qty 1

## 2023-06-06 MED ORDER — ONDANSETRON HCL 4 MG/2ML IJ SOLN: 4.0000 mg | Freq: Four times a day (QID) | INTRAMUSCULAR | Status: DC | PRN

## 2023-06-06 MED ORDER — ACETAMINOPHEN 650 MG RE SUPP: 650.0000 mg | Freq: Four times a day (QID) | RECTAL | Status: DC | PRN

## 2023-06-06 MED ORDER — ONDANSETRON HCL 4 MG PO TABS: 4.0000 mg | ORAL_TABLET | Freq: Four times a day (QID) | ORAL | Status: DC | PRN

## 2023-06-06 MED ORDER — ENOXAPARIN SODIUM 80 MG/0.8ML IJ SOSY
0.5000 mg/kg | PREFILLED_SYRINGE | INTRAMUSCULAR | Status: DC
  Administered 2023-06-06: 62.5 mg via SUBCUTANEOUS
  Filled 2023-06-06: qty 0.8

## 2023-06-06 MED ORDER — METHYLPREDNISOLONE SODIUM SUCC 125 MG IJ SOLR: 125.0000 mg | Freq: Once | INTRAMUSCULAR | Status: DC

## 2023-06-06 MED ORDER — OXYCODONE HCL 5 MG PO TABS: 5.0000 mg | ORAL_TABLET | Freq: Four times a day (QID) | ORAL | Status: DC | PRN

## 2023-06-06 MED ORDER — METHYLPREDNISOLONE SODIUM SUCC 125 MG IJ SOLR
120.0000 mg | Freq: Every day | INTRAMUSCULAR | Status: DC
  Administered 2023-06-07: 120 mg via INTRAVENOUS
  Filled 2023-06-06: qty 2

## 2023-06-06 MED ORDER — IPRATROPIUM-ALBUTEROL 0.5-2.5 (3) MG/3ML IN SOLN
3.0000 mL | RESPIRATORY_TRACT | Status: DC
  Administered 2023-06-06 – 2023-06-09 (×15): 3 mL via RESPIRATORY_TRACT
  Filled 2023-06-06 (×16): qty 3

## 2023-06-06 MED ORDER — FENTANYL CITRATE PF 50 MCG/ML IJ SOSY: 25.0000 ug | PREFILLED_SYRINGE | INTRAMUSCULAR | Status: DC | PRN

## 2023-06-06 MED ORDER — ALBUTEROL SULFATE (2.5 MG/3ML) 0.083% IN NEBU
INHALATION_SOLUTION | RESPIRATORY_TRACT | Status: AC
  Administered 2023-06-06: 10 mg/h via RESPIRATORY_TRACT
  Filled 2023-06-06: qty 12

## 2023-06-06 MED ORDER — IPRATROPIUM-ALBUTEROL 0.5-2.5 (3) MG/3ML IN SOLN
3.0000 mL | RESPIRATORY_TRACT | Status: DC | PRN
  Administered 2023-06-06: 3 mL via RESPIRATORY_TRACT

## 2023-06-06 NOTE — ED Notes (Signed)
ED TO INPATIENT HANDOFF REPORT  ED Nurse Name and Phone #: Gearlean Alf Name/Age/Gender Heather Mckinney 52 y.o. female Room/Bed: APA10/APA10  Code Status   Code Status: Prior  Home/SNF/Other Home Patient oriented to: self, place, time, and situation Is this baseline? Yes   Triage Complete: Triage complete  Chief Complaint Acute respiratory failure with hypoxia (HCC) [J96.01]  Triage Note Pt brought in by [facility] from work with c/o SOB. Pt was diagnosed with bronchitis on Friday and given Albuterol and steroid inhaler. Pt's initial O2 sat 88% on RA, pt was given Duoneb and Solumedrol 125mg . EMS reports pt was extremely SOB upon their arrival. Audible wheezing on arrival. Pt reports slight relief with breathing treatment.    Allergies Allergies  Allergen Reactions   Ephedrine Other (See Comments)    Patient states she went into cardiac arrest after taking sudafed.   Aspirin Other (See Comments)    Hearing loss     Level of Care/Admitting Diagnosis ED Disposition     ED Disposition  Admit   Condition  --   Comment  Hospital Area: [facility] [[id number]]  Level of Care: Telemetry [5]  Covid Evaluation: Asymptomatic - no recent exposure (last 10 days) testing not required  Diagnosis: Acute respiratory failure with hypoxia [facility]) [[id number]]  Admitting Physician: Cleora Fleet [[id number]]  Attending Physician: Cleora Fleet [[id number]]  Certification:: I certify this patient will need inpatient services for at least 2 midnights  Estimated Length of Stay: 4          B Medical/Surgery History Past Medical History:  Diagnosis Date   Anxiety    Arthritis    Asthma    COPD (chronic obstructive pulmonary disease) (HCC)    Depression    Fibromyalgia    Migraine    Past Surgical History:  Procedure Laterality Date   BIOPSY  02/10/2023   Procedure: BIOPSY;  Surgeon: Quentin Ore, MD;  Location: [facility] ENDOSCOPY;  Service: General;;   CESAREAN SECTION     x  3   CHOLECYSTECTOMY     ESOPHAGOGASTRODUODENOSCOPY N/A 02/10/2023   Procedure: ESOPHAGOGASTRODUODENOSCOPY (EGD);  Surgeon: Quentin Ore, MD;  Location: [facility];  Service: General;  Laterality: N/A;   HIATAL HERNIA REPAIR N/A 04/15/2023   Procedure: HERNIA REPAIR HIATAL;  Surgeon: Quentin Ore, MD;  Location: [facility] ORS;  Service: General;  Laterality: N/A;   ROUX-EN-Y GASTRIC BYPASS       A IV Location/Drains/Wounds Patient Lines/Drains/Airways Status     Active Line/Drains/Airways     Name Placement date Placement time Site Days   Peripheral IV 06/06/23 20 G 1" Right Antecubital 06/06/23  1100  Antecubital  less than 1   Incision - 5 Ports Abdomen Left;Mid;Lateral Left;Mid Right;Mid Right;Mid;Lateral Mid;Upper 04/15/23  0750  -- 52            Intake/Output Last 24 hours No intake or output data in the 24 hours ending 06/06/23 1346  Labs/Imaging Results for orders placed or performed during the hospital encounter of 06/06/23 (from the past 48 hour(s))  Basic metabolic panel     Status: Abnormal   Collection Time: 06/06/23 12:06 PM  Result Value Ref Range   Sodium 138 135 - 145 mmol/L   Potassium 3.8 3.5 - 5.1 mmol/L   Chloride 106 98 - 111 mmol/L   CO2 25 22 - 32 mmol/L   Glucose, Bld 123 (H) 70 - 99 mg/dL    Comment: Glucose reference range applies  only to samples taken after fasting for at least 8 hours.   BUN 23 (H) 6 - 20 mg/dL   Creatinine, Ser 1.61 0.44 - 1.00 mg/dL   Calcium 9.2 8.9 - 09.6 mg/dL   GFR, Estimated >04 >54 mL/min    Comment: (NOTE) Calculated using the CKD-EPI Creatinine Equation (2021)    Anion gap 7 5 - 15    Comment: Performed at [facility], [address] [place], [place] [address]  CBC with Differential/Platelet     Status: Abnormal   Collection Time: 06/06/23 12:06 PM  Result Value Ref Range   WBC 10.5 4.0 - 10.5 K/uL   RBC 4.18 3.87 - 5.11 MIL/uL   Hemoglobin 12.7 12.0 - 15.0 g/dL   HCT 91.4 78.2 - 95.6 %   MCV 94.5  80.0 - 100.0 fL   MCH 30.4 26.0 - 34.0 pg   MCHC 32.2 30.0 - 36.0 g/dL   RDW 21.3 08.6 - 57.8 %   Platelets 290 150 - 400 K/uL   nRBC 0.0 0.0 - 0.2 %   Neutrophils Relative % 82 %   Neutro Abs 8.5 (H) 1.7 - 7.7 K/uL   Lymphocytes Relative 11 %   Lymphs Abs 1.2 0.7 - 4.0 K/uL   Monocytes Relative 5 %   Monocytes Absolute 0.5 0.1 - 1.0 K/uL   Eosinophils Relative 2 %   Eosinophils Absolute 0.3 0.0 - 0.5 K/uL   Basophils Relative 0 %   Basophils Absolute 0.0 0.0 - 0.1 K/uL   Immature Granulocytes 0 %   Abs Immature Granulocytes 0.03 0.00 - 0.07 K/uL    Comment: Performed at [facility], [address] [place], [place] [address]  SARS Coronavirus 2 by RT PCR (hospital order, performed in [facility] hospital lab) *cepheid single result test* Anterior Nasal Swab     Status: None   Collection Time: 06/06/23 12:54 PM   Specimen: Anterior Nasal Swab  Result Value Ref Range   SARS Coronavirus 2 by RT PCR NEGATIVE NEGATIVE    Comment: (NOTE) SARS-CoV-2 target nucleic acids are NOT DETECTED.  The SARS-CoV-2 RNA is generally detectable in upper and lower respiratory specimens during the acute phase of infection. The lowest concentration of SARS-CoV-2 viral copies this assay can detect is 250 copies / mL. A negative result does not preclude SARS-CoV-2 infection and should not be used as the sole basis for treatment or other patient management decisions.  A negative result may occur with improper specimen collection / handling, submission of specimen other than nasopharyngeal swab, presence of viral mutation(s) within the areas targeted by this assay, and inadequate number of viral copies (<250 copies / mL). A negative result must be combined with clinical observations, patient history, and epidemiological information.  Fact Sheet for Patients:   RoadLapTop.co.za  Fact Sheet for Healthcare Providers: http://kim-miller.com/  This test is not  yet approved or  cleared by the [place] FDA and has been authorized for detection and/or diagnosis of SARS-CoV-2 by FDA under an Emergency Use Authorization (EUA).  This EUA will remain in effect (meaning this test can be used) for the duration of the COVID-19 declaration under Section 564(b)(1) of the Act, 21 U.S.C. section 360bbb-3(b)(1), unless the authorization is terminated or revoked sooner.  Performed at [facility], [address] [place], [place] [address]    DG Chest Port 1 View  Result Date: 06/06/2023 CLINICAL DATA:  Shortness of breath EXAM: PORTABLE CHEST 1 VIEW COMPARISON:  05/29/2023 FINDINGS: The patient  is rotated to the left on today's radiograph, reducing diagnostic sensitivity and specificity. Apical lordotic projection. Cardiac and mediastinal margins appear normal. Accounting for the leftward rotation, the lungs appear clear. No blunting of the costophrenic angles. No acute bony abnormality. IMPRESSION: 1. No acute findings. 2. Leftward rotation of the patient on today's radiograph. Electronically Signed   By: Gaylyn Rong M.D.   On: 06/06/2023 11:52    Pending Labs Unresulted Labs (From admission, onward)    None       Vitals/Pain Today's Vitals   06/06/23 1315 06/06/23 1318 06/06/23 1322 06/06/23 1330  BP:    123/74  Pulse: 77 78 78 73  Resp: (!) 22 (!) 27 (!) 24 (!) 24  Temp:      TempSrc:      SpO2: (!) 88% (!) 87% 93% 91%  Weight:      Height:      PainSc:        Isolation Precautions Airborne and Contact precautions  Medications Medications  albuterol (PROVENTIL) (2.5 MG/3ML) 0.083% nebulizer solution (10 mg/hr Nebulization Given 06/06/23 1133)  magnesium sulfate IVPB 2 g 50 mL (0 g Intravenous Stopped 06/06/23 1319)    Mobility walks     Focused Assessments Pulmonary Assessment Handoff:  Lung sounds: Bilateral Breath Sounds: Inspiratory wheezes, Expiratory wheezes L Breath Sounds: Expiratory wheezes R Breath Sounds:  Expiratory wheezes O2 Device: Nasal Cannula O2 Flow Rate (L/min): 2 L/min    R Recommendations: See Admitting Provider Note  Report given to:   Additional Notes:

## 2023-06-06 NOTE — Progress Notes (Signed)
Patient called to the desk to state she couldn't breathe. Went to evaluate for primary nurse. Patient o2 sats on her current oxygen are 95%. Instructed to slow breathing, and try to breathe in through her nose and out through her mouth. Sat bed up for patient and notified Dr. Laural Benes.

## 2023-06-06 NOTE — H&P (Signed)
History and Physical  [facility]  [place] UJW:[id number] DOB: 1971/07/16 DOA: 06/06/2023  PCP: [name], NP  Patient coming from: Home by [facility]  Level of care: Telemetry  I have personally briefly reviewed patient's old medical records in [facility]  Chief Complaint: SOB   HPI: Heather Mckinney is a 52  year old female with morbid obesity, childhood asthma, COPD from second-hand smoke exposure, recently status post robotic Roux-en-Y gastric bypass and hiatal hernia repair, anxiety, depression, fibromyalgia, GERD who had been doing well since surgery on 04/15/23.  She had started back working.  She has lost over 40 pounds since surgery.  She reports that she normally gets acute bronchitis twice per year.  She says that she started having cough, chest congestion, wheezing over the past couple of days and it only progressively got worse.  She had been taking bronchodilators at home but continued to have chest tightness. She denies chest pain.  She reports that she was at work today and started with severe wheezing and shortness of breath and had to leave work early. She was fatigued and felt miserable.  She was walking to her car and she became even more short of breath and she started having anxiety and a panic attack and she ended up calling EMS.  They found her to be hypoxic with a pulse ox in the 80s and they started her on supplemental oxygen and gave her a dose of solumedrol and brought her to the ED.  Her work up in the ED confirmed a new oxygen requirement.  No acute changes noted on EKG. She was diffusely wheezing and short of breath.  She received a continuous nebulizer treatment and still remained very symptomatic.  Admission to the hospital was requested for further management.      Past Medical History:  Diagnosis Date   Anxiety    Arthritis    Asthma    COPD (chronic obstructive pulmonary disease) (HCC)    Depression    Fibromyalgia    Migraine     Past  Surgical History:  Procedure Laterality Date   BIOPSY  02/10/2023   Procedure: BIOPSY;  Surgeon: Quentin Ore, MD;  Location: [facility];  Service: General;;   CESAREAN SECTION     x 3   CHOLECYSTECTOMY     ESOPHAGOGASTRODUODENOSCOPY N/A 02/10/2023   Procedure: ESOPHAGOGASTRODUODENOSCOPY (EGD);  Surgeon: Quentin Ore, MD;  Location: [facility];  Service: General;  Laterality: N/A;   HIATAL HERNIA REPAIR N/A 04/15/2023   Procedure: HERNIA REPAIR HIATAL;  Surgeon: Quentin Ore, MD;  Location: [facility]S;  Service: General;  Laterality: N/A;   ROUX-EN-Y GASTRIC BYPASS       reports that she has never smoked. She has been exposed to tobacco smoke. She has never used smokeless tobacco. She reports that she does not currently use alcohol. She reports that she does not use drugs.  Allergies  Allergen Reactions   Ephedrine Other (See Comments)    Patient states she went into cardiac arrest after taking sudafed.   Aspirin Other (See Comments)    Hearing loss     Family History  Problem Relation Age of Onset   Diabetes Mother    Hypertension Mother    Asthma Mother    Cerebral aneurysm Mother    Heart Problems Mother    Migraines Mother    Migraines Daughter    Vision loss Daughter    Hydrocephalus Son     Prior  to Admission medications   Medication Sig Start Date End Date Taking? Authorizing Provider  budesonide-formoterol (SYMBICORT) 80-4.5 MCG/ACT inhaler Inhale 2 puffs into the lungs 2 (two) times daily. 05/30/23  Yes [provider]  albuterol (VENTOLIN HFA) 108 (90 Base) MCG/ACT inhaler Inhale 1-2 puffs into the lungs every 6 (six) hours as needed for wheezing or shortness of breath. 05/29/23   Schutt, Edsel Petrin, PA-C  calcium carbonate (OS-CAL) 1250 (500 Ca) MG chewable tablet Chew 1 tablet by mouth daily.    [provider]  diclofenac (VOLTAREN) 75 MG EC tablet Take 75 mg by mouth 2 (two) times daily. 05/07/23   [provider]   DULoxetine (CYMBALTA) 20 MG capsule Take 20 mg by mouth daily. 11/13/22   [provider]  fluticasone (FLOVENT HFA) 44 MCG/ACT inhaler Inhale 1 puff into the lungs daily. 05/29/23   Schutt, Edsel Petrin, PA-C  fluticasone-salmeterol (ADVAIR HFA) [phone] MCG/ACT inhaler Inhale 2 puffs into the lungs 2 (two) times daily. 05/29/23   Schutt, Edsel Petrin, PA-C  gabapentin (NEURONTIN) 300 MG capsule Take 300 mg by mouth 2 (two) times daily.    [provider]  methocarbamol (ROBAXIN-750) 750 MG tablet Take 1 tablet (750 mg total) by mouth every 6 (six) hours as needed for muscle spasms. 04/16/23   Stechschulte, Hyman Hopes, MD  Multiple Vitamin (MULTIVITAMIN) capsule Take 1 capsule by mouth daily.    [provider]  ondansetron (ZOFRAN-ODT) 4 MG disintegrating tablet Take 1 tablet (4 mg total) by mouth every 6 (six) hours as needed for nausea or vomiting. 04/16/23   Stechschulte, Hyman Hopes, MD  oxyCODONE (OXY IR/ROXICODONE) 5 MG immediate release tablet Take 1 tablet (5 mg total) by mouth every 6 (six) hours as needed for severe pain. Patient not taking: Reported on 05/29/2023 04/16/23   Stechschulte, Hyman Hopes, MD  pantoprazole (PROTONIX) 40 MG tablet Take 1 tablet (40 mg total) by mouth daily. 04/16/23   Stechschulte, Hyman Hopes, MD    Physical Exam: Vitals:   06/06/23 1330 06/06/23 1400 06/06/23 1415 06/06/23 1429  BP: 123/74 132/80  (!) 148/87  Pulse: 73 80 75 81  Resp: (!) 24 (!) 21 (!) 31 (!) 26  Temp:   98.9 F (37.2 C) 98.1 F (36.7 C)  TempSrc:   Oral Oral  SpO2: 91% (!) 89% 90% 95%  Weight:    125.6 kg  Height:    5\' 8"  (1.727 m)    Constitutional: pt is obese, NAD, calm, comfortable on supplemental oxygen [place] Eyes: PERRL, lids and conjunctivae normal ENMT: Mucous membranes are moist. Posterior pharynx clear of any exudate or lesions.Normal dentition.  Neck: normal, supple, no masses, no thyromegaly Respiratory: diffuse wheezing bilateral, moderate increased work of breathing.    Cardiovascular: normal s1, s2 sounds, no murmurs / rubs / gallops. No extremity edema. 2+ pedal pulses. No carotid bruits.  Abdomen: no tenderness, no masses palpated. No hepatosplenomegaly. Bowel sounds positive.  Musculoskeletal: no clubbing / cyanosis. No joint deformity upper and lower extremities. Good ROM, no contractures. Normal muscle tone.  Skin: no rashes, lesions, ulcers. No induration Neurologic: CN 2-12 grossly intact. Sensation intact, DTR normal. Strength 5/5 in all 4.  Psychiatric: Normal judgment and insight. Alert and oriented x 3. Normal mood.   Labs on Admission: I have personally reviewed following labs and imaging studies  CBC: Recent Labs  Lab 06/06/23 1206  WBC 10.5  NEUTROABS 8.5*  HGB 12.7  HCT 39.5  MCV 94.5  PLT 290  Basic Metabolic Panel: Recent Labs  Lab 06/06/23 1206  NA 138  K 3.8  CL 106  CO2 25  GLUCOSE 123*  BUN 23*  CREATININE 0.51  CALCIUM 9.2   GFR: Estimated Creatinine Clearance: 115.1 mL/min (by C-G formula based on SCr of 0.51 mg/dL). Liver Function Tests: No results for input(s): "AST", "ALT", "ALKPHOS", "BILITOT", "PROT", "ALBUMIN" in the last 168 hours. No results for input(s): "LIPASE", "AMYLASE" in the last 168 hours. No results for input(s): "AMMONIA" in the last 168 hours. Coagulation Profile: No results for input(s): "INR", "PROTIME" in the last 168 hours. Cardiac Enzymes: No results for input(s): "CKTOTAL", "CKMB", "CKMBINDEX", "TROPONINI" in the last 168 hours. BNP (last 3 results) No results for input(s): "PROBNP" in the last 8760 hours. HbA1C: No results for input(s): "HGBA1C" in the last 72 hours. CBG: No results for input(s): "GLUCAP" in the last 168 hours. Lipid Profile: No results for input(s): "CHOL", "HDL", "LDLCALC", "TRIG", "CHOLHDL", "LDLDIRECT" in the last 72 hours. Thyroid Function Tests: No results for input(s): "TSH", "T4TOTAL", "FREET4", "T3FREE", "THYROIDAB" in the last 72 hours. Anemia  Panel: No results for input(s): "VITAMINB12", "FOLATE", "FERRITIN", "TIBC", "IRON", "RETICCTPCT" in the last 72 hours. Urine analysis: No results found for: "COLORURINE", "APPEARANCEUR", "LABSPEC", "PHURINE", "GLUCOSEU", "HGBUR", "BILIRUBINUR", "KETONESUR", "PROTEINUR", "UROBILINOGEN", "NITRITE", "LEUKOCYTESUR"  Radiological Exams on Admission: DG Chest Port 1 View  Result Date: 06/06/2023 CLINICAL DATA:  Shortness of breath EXAM: PORTABLE CHEST 1 VIEW COMPARISON:  05/29/2023 FINDINGS: The patient is rotated to the left on today's radiograph, reducing diagnostic sensitivity and specificity. Apical lordotic projection. Cardiac and mediastinal margins appear normal. Accounting for the leftward rotation, the lungs appear clear. No blunting of the costophrenic angles. No acute bony abnormality. IMPRESSION: 1. No acute findings. 2. Leftward rotation of the patient on today's radiograph. Electronically Signed   By: Gaylyn Rong M.D.   On: 06/06/2023 11:52    EKG: Independently reviewed.   Assessment/Plan Principal Problem:   Acute respiratory failure with hypoxia (HCC) Active Problems:   Sleep related headaches   Pulmonary emphysema (HCC)   Morbid obesity with BMI of 45.0-49.9, adult (HCC)   History of Roux-en-Y gastric bypass   Asthma with acute exacerbation   Acute respiratory failure with hypoxia -pt presenting with a new oxygen requirement -secondary to severe COPD exacerbation  -wean oxygen to room air as able   COPD acute exacerbation  -pt reports she got this from 2nd hand smoke exposure -treating aggressively with IV steroids -scheduled bronchodilators -adding scheduled mucolytics -add cough suppressant as needed -add oral doxycycline 100 mg BID  Morbid Obesity  -recently s/p robotic roux-n-Y with hiatal hernia repair -Pt has lost 40 pounds in last month -Bariatric diet ordered  Anxiety and depression  -Pt had a pretty severe panic attack earlier today but is better  now -resumed all of her home behavioral health medications  DVT prophylaxis: enoxaparin   Code Status: Full   Family Communication: husband still traveling to hospital   Disposition Plan: anticipate home   Consults called:   Admission status: INP  Level of care: Telemetry Standley Dakins MD [facility] How to contact the [facility] Attending or Consulting provider 7A - 7P or covering provider during after hours 7P -7A, for this patient?  Check the care team in [facility] and look for a) attending/consulting [facility] provider listed and b) the [facility] team listed Log into [email or web address] and use [place] universal password to access. If you do not have the password, please contact the hospital  operator. Locate the [facility] provider you are looking for under [facility] and page to a number that you can be directly reached. If you still have difficulty reaching the provider, please page the [facility] (Director on Call) for the Hospitalists listed on amion for assistance.   If 7PM-7AM, please contact night-coverage [email or web address] Password [email or web address]  06/06/2023, 3:20 PM

## 2023-06-06 NOTE — ED Triage Notes (Signed)
Pt brought in by [facility] from work with c/o SOB. Pt was diagnosed with bronchitis on Friday and given Albuterol and steroid inhaler. Pt's initial O2 sat 88% on RA, pt was given Duoneb and Solumedrol 125mg . EMS reports pt was extremely SOB upon their arrival. Audible wheezing on arrival. Pt reports slight relief with breathing treatment.

## 2023-06-06 NOTE — Progress Notes (Signed)
PHARMACIST - PHYSICIAN COMMUNICATION   CONCERNING: Methylprednisolone IV    Current order: Methylprednisolone IV 60 mg q12H     DESCRIPTION: Per [place] Protocol:   IV methylprednisolone will be converted to either a q12h or q24h frequency with the same total daily dose (TDD).  Ordered Dose: 1 to 125 mg TDD; convert to: TDD q24h.  Ordered Dose: 126 to 250 mg TDD; convert to: TDD div q12h.  Ordered Dose: >250 mg TDD; DAW.  Order has been adjusted to: Methylprednisolone IV 120 mg q24H   Will M. Dareen Piano, PharmD PGY-1 Pharmacy Resident 06/06/2023 2:59 PM

## 2023-06-06 NOTE — ED Notes (Signed)
Pt spo2 87-88% on RA.  Placed pt on 2L [place]

## 2023-06-06 NOTE — Progress Notes (Signed)
   06/06/23 1649  Assess: MEWS Score  Temp 98.2 F (36.8 C)  BP 133/85  MAP (mmHg) 98  Pulse Rate 70  Resp (!) 22  SpO2 95 %  O2 Device Nasal Cannula  O2 Flow Rate (L/min) 4 L/min  Assess: MEWS Score  MEWS Temp 0  MEWS Systolic 0  MEWS Pulse 0  MEWS RR 1  MEWS LOC 0  MEWS Score 1  MEWS Score Color Green  Assess: SIRS CRITERIA  SIRS Temperature  0  SIRS Pulse 0  SIRS Respirations  1  SIRS WBC 0  SIRS Score Sum  1

## 2023-06-06 NOTE — Hospital Course (Signed)
52  year old female with morbid obesity, childhood asthma, COPD from second-hand smoke exposure, recently status post robotic Roux-en-Y gastric bypass and hiatal hernia repair, anxiety, depression, fibromyalgia, GERD who had been doing well since surgery on 04/15/23.  She had started back working.  She has lost over 40 pounds since surgery.  She reports that she normally gets acute bronchitis twice per year.  She says that she started having cough, chest congestion, wheezing over the past couple of days and it only progressively got worse.  She had been taking bronchodilators at home but continued to have chest tightness. She denies chest pain.  She reports that she was at work today and started with severe wheezing and shortness of breath and had to leave work early. She was fatigued and felt miserable.  She was walking to her car and she became even more short of breath and she started having anxiety and a panic attack and she ended up calling EMS.  They found her to be hypoxic with a pulse ox in the 80s and they started her on supplemental oxygen and gave her a dose of solumedrol and brought her to the ED.  Her work up in the ED confirmed a new oxygen requirement.  No acute changes noted on EKG. She was diffusely wheezing and short of breath.  She received a continuous nebulizer treatment and still remained very symptomatic.  Admission to the hospital was requested for further management.

## 2023-06-06 NOTE — Progress Notes (Signed)
PHARMACIST - PHYSICIAN COMMUNICATION  CONCERNING:  Enoxaparin (Lovenox) for DVT Prophylaxis   ASSESSMENT: Patient was prescribed enoxaparin 40 mg subcutaneously every 24 hours for VTE prophylaxis.   Body mass index is 42.1 kg/m.  Estimated Creatinine Clearance: 115.1 mL/min (by C-G formula based on SCr of 0.51 mg/dL).  Based on [facility] policy, patient qualifies for enoxaparin dosing of 0.5 mg per kilogram of total body weight every 24 hours because their body mass index is >30 kg/m2.  PLAN: Pharmacy has adjusted enoxaparin dose per [facility] policy.  Description: Patient is now receiving enoxaparin 0.5 mg/kg subcutaneously every 24 hours.   Will M. Dareen Piano, PharmD PGY-1 Pharmacy Resident 06/06/2023 2:54 PM

## 2023-06-06 NOTE — ED Notes (Signed)
CAT complete.  Pt conitnues to have audible wheezing.  Pt back on RA at this time.  On monitoring.  Will closely monitor

## 2023-06-06 NOTE — Progress Notes (Signed)
   06/06/23 1429  Assess: MEWS Score  Temp 98.1 F (36.7 C)  BP (!) 148/87  MAP (mmHg) 104  Pulse Rate 81  Resp (!) 26  SpO2 95 %  O2 Device Nasal Cannula  O2 Flow Rate (L/min) 4 L/min  Assess: MEWS Score  MEWS Temp 0  MEWS Systolic 0  MEWS Pulse 0  MEWS RR 2  MEWS LOC 0  MEWS Score 2  MEWS Score Color Yellow  Assess: if the MEWS score is Yellow or Red  Were vital signs taken at a resting state? Yes  Focused Assessment Change from prior assessment (see assessment flowsheet)  Does the patient meet 2 or more of the SIRS criteria? No  MEWS guidelines implemented  Yes, yellow  Treat  MEWS Interventions Considered administering scheduled or prn medications/treatments as ordered  Take Vital Signs  Increase Vital Sign Frequency  Yellow: Q2hr x1, continue Q4hrs until patient remains green for 12hrs  Escalate  MEWS: Escalate Yellow: Discuss with charge nurse and consider notifying provider and/or RRT  Notify: Charge Nurse/RN  Name of Charge Nurse/RN Notified Brandie RN  Provider Notification  Provider Name/Title MD Laural Benes  Date Provider Notified 06/06/23  Time Provider Notified 1435  Method of Notification Page (Secure chat)  Notification Reason Other (Comment) (Yellow MEWs)  Provider response See new orders  Date of Provider Response 06/06/23  Time of Provider Response 1435  Assess: SIRS CRITERIA  SIRS Temperature  0  SIRS Pulse 0  SIRS Respirations  1  SIRS WBC 0  SIRS Score Sum  1

## 2023-06-06 NOTE — ED Provider Notes (Signed)
[place] EMERGENCY DEPARTMENT AT [facility] Provider Note   CSN: [id number] Arrival date & time: 06/06/23  1055     History  Chief Complaint  Patient presents with   Shortness of Breath    Heather Mckinney is a 52 y.o. female.  She is brought in by EMS for worsening shortness of breath.  She said she started with bronchitis symptoms a week ago came here and received an inhaler.  She has been feeling more short of breath since yesterday and today had to leave work.  Ultimately called 911 was given albuterol nebulizer treatment and 125 Solu-Medrol.  She said she has a history of COPD secondary to secondhand smoke and asthma.  The history is provided by the patient.  Shortness of Breath Severity:  Severe Onset quality:  Gradual Duration:  2 days Timing:  Constant Progression:  Worsening Chronicity:  New Relieved by:  Nothing Worsened by:  Activity and coughing Ineffective treatments:  Inhaler Associated symptoms: cough   Associated symptoms: no abdominal pain, no chest pain, no fever, no hemoptysis, no sputum production and no vomiting   Risk factors: no tobacco use        Home Medications Prior to Admission medications   Medication Sig Start Date End Date Taking? Authorizing Provider  albuterol (VENTOLIN HFA) 108 (90 Base) MCG/ACT inhaler Inhale 1-2 puffs into the lungs every 6 (six) hours as needed for wheezing or shortness of breath. 05/29/23   Schutt, Edsel Petrin, PA-C  calcium carbonate (OS-CAL) 1250 (500 Ca) MG chewable tablet Chew 1 tablet by mouth daily.    [provider]  diclofenac (VOLTAREN) 75 MG EC tablet Take 75 mg by mouth 2 (two) times daily. 05/07/23   [provider]  DULoxetine (CYMBALTA) 20 MG capsule Take 20 mg by mouth daily. 11/13/22   [provider]  fluticasone (FLOVENT HFA) 44 MCG/ACT inhaler Inhale 1 puff into the lungs daily. 05/29/23   Schutt, Edsel Petrin, PA-C  fluticasone-salmeterol (ADVAIR HFA) [phone] MCG/ACT  inhaler Inhale 2 puffs into the lungs 2 (two) times daily. 05/29/23   Schutt, Edsel Petrin, PA-C  gabapentin (NEURONTIN) 300 MG capsule Take 300 mg by mouth 2 (two) times daily.    [provider]  methocarbamol (ROBAXIN-750) 750 MG tablet Take 1 tablet (750 mg total) by mouth every 6 (six) hours as needed for muscle spasms. 04/16/23   Stechschulte, Hyman Hopes, MD  Multiple Vitamin (MULTIVITAMIN) capsule Take 1 capsule by mouth daily.    [provider]  ondansetron (ZOFRAN-ODT) 4 MG disintegrating tablet Take 1 tablet (4 mg total) by mouth every 6 (six) hours as needed for nausea or vomiting. 04/16/23   Stechschulte, Hyman Hopes, MD  oxyCODONE (OXY IR/ROXICODONE) 5 MG immediate release tablet Take 1 tablet (5 mg total) by mouth every 6 (six) hours as needed for severe pain. Patient not taking: Reported on 05/29/2023 04/16/23   Stechschulte, Hyman Hopes, MD  pantoprazole (PROTONIX) 40 MG tablet Take 1 tablet (40 mg total) by mouth daily. 04/16/23   Stechschulte, Hyman Hopes, MD      Allergies    Ephedrine and Aspirin    Review of Systems   Review of Systems  Constitutional:  Negative for fever.  Respiratory:  Positive for cough and shortness of breath. Negative for hemoptysis and sputum production.   Cardiovascular:  Negative for chest pain.  Gastrointestinal:  Negative for abdominal pain and vomiting.    Physical Exam Updated Vital Signs BP 137/80 (BP Location: Left Arm)  Pulse 87   Resp (!) 24   Ht 5\' 8"  (1.727 m)   Wt 125.6 kg   LMP 12/25/2019   SpO2 95%   BMI 42.12 kg/m  Physical Exam Vitals and nursing note reviewed.  Constitutional:      General: She is not in acute distress.    Appearance: She is well-developed. She is obese.  HENT:     Head: Normocephalic and atraumatic.  Eyes:     Conjunctiva/sclera: Conjunctivae normal.  Cardiovascular:     Rate and Rhythm: Normal rate and regular rhythm.     Heart sounds: No murmur heard. Pulmonary:     Effort: Tachypnea and accessory  muscle usage present. No respiratory distress.     Breath sounds: Wheezing present.  Abdominal:     Palpations: Abdomen is soft.     Tenderness: There is no abdominal tenderness.  Musculoskeletal:        General: No swelling.     Cervical back: Neck supple.  Skin:    General: Skin is warm and dry.     Capillary Refill: Capillary refill takes less than 2 seconds.  Neurological:     General: No focal deficit present.     Mental Status: She is alert.     Sensory: No sensory deficit.     Motor: No weakness.     ED Results / Procedures / Treatments   Labs (all labs ordered are listed, but only abnormal results are displayed) Labs Reviewed  BASIC METABOLIC PANEL - Abnormal; Notable for the following components:      Result Value   Glucose, Bld 123 (*)    BUN 23 (*)    All other components within normal limits  CBC WITH DIFFERENTIAL/PLATELET - Abnormal; Notable for the following components:   Neutro Abs 8.5 (*)    All other components within normal limits  SARS CORONAVIRUS 2 BY RT PCR  HIV ANTIBODY (ROUTINE TESTING W REFLEX)  BASIC METABOLIC PANEL  MAGNESIUM    EKG EKG Interpretation  Date/Time:  Friday June 06 2023 11:11:52 EDT Ventricular Rate:  82 PR Interval:  161 QRS Duration: 94 QT Interval:  374 QTC Calculation: 437 R Axis:   75 Text Interpretation: Sinus rhythm nonspecific STs new from prior 1/24 Confirmed by Meridee Score [phone]) on 06/06/2023 11:32:47 AM  Radiology DG Chest Port 1 View  Result Date: 06/06/2023 CLINICAL DATA:  Shortness of breath EXAM: PORTABLE CHEST 1 VIEW COMPARISON:  05/29/2023 FINDINGS: The patient is rotated to the left on today's radiograph, reducing diagnostic sensitivity and specificity. Apical lordotic projection. Cardiac and mediastinal margins appear normal. Accounting for the leftward rotation, the lungs appear clear. No blunting of the costophrenic angles. No acute bony abnormality. IMPRESSION: 1. No acute findings. 2. Leftward  rotation of the patient on today's radiograph. Electronically Signed   By: Gaylyn Rong M.D.   On: 06/06/2023 11:52    Procedures .Critical Care  Performed by: Terrilee Files, MD Authorized by: Terrilee Files, MD   Critical care provider statement:    Critical care time (minutes):  45   Critical care time was exclusive of:  Separately billable procedures and treating other patients   Critical care was necessary to treat or prevent imminent or life-threatening deterioration of the following conditions:  Respiratory failure   Critical care was time spent personally by me on the following activities:  Development of treatment plan with patient or surrogate, discussions with consultants, evaluation of patient's response to treatment, examination of  patient, obtaining history from patient or surrogate, ordering and performing treatments and interventions, ordering and review of laboratory studies, ordering and review of radiographic studies, pulse oximetry and re-evaluation of patient's condition   I assumed direction of critical care for this patient from another provider in my specialty: no       Medications Ordered in ED Medications  enoxaparin (LOVENOX) injection 62.5 mg (has no administration in time range)  acetaminophen (TYLENOL) tablet 650 mg (has no administration in time range)    Or  acetaminophen (TYLENOL) suppository 650 mg (has no administration in time range)  oxyCODONE (Oxy IR/ROXICODONE) immediate release tablet 5 mg (has no administration in time range)  fentaNYL (SUBLIMAZE) injection 25 mcg (has no administration in time range)  bisacodyl (DULCOLAX) EC tablet 5 mg (has no administration in time range)  ondansetron (ZOFRAN) tablet 4 mg (has no administration in time range)    Or  ondansetron (ZOFRAN) injection 4 mg (has no administration in time range)  methylPREDNISolone sodium succinate (SOLU-MEDROL) 125 mg/2 mL injection 120 mg (has no administration in time  range)  traZODone (DESYREL) tablet 100 mg (has no administration in time range)  doxycycline (VIBRA-TABS) tablet 100 mg (has no administration in time range)  ipratropium-albuterol (DUONEB) 0.5-2.5 (3) MG/3ML nebulizer solution 3 mL (3 mLs Nebulization Given 06/06/23 1608)  ipratropium-albuterol (DUONEB) 0.5-2.5 (3) MG/3ML nebulizer solution 3 mL (has no administration in time range)  ALPRAZolam (XANAX) tablet 0.5 mg (0.5 mg Oral Given 06/06/23 1556)  albuterol (PROVENTIL) (2.5 MG/3ML) 0.083% nebulizer solution (10 mg/hr Nebulization Given 06/06/23 1133)  magnesium sulfate IVPB 2 g 50 mL (0 g Intravenous Stopped 06/06/23 1319)    ED Course/ Medical Decision Making/ A&P Clinical Course as of 06/06/23 1718  Fri Jun 06, 2023  1121 EKG not crossing in epic.  Normal sinus rhythm nonspecific ST-T changes. [MB]  1147 Chest x-ray interpreted by me as no acute infiltrates.  Awaiting radiology reading. [MB]  1228 Reevaluation, still with diffuse wheezing and shortness of breath.  Requiring oxygen. [MB]  1327 Discussed with Dr. Laural Benes [facility] hospitalist who will evaluate patient for admission. [MB]    Clinical Course User Index [MB] Terrilee Files, MD                             Medical Decision Making Amount and/or Complexity of Data Reviewed Labs: ordered. Radiology: ordered.  Risk Prescription drug management. Decision regarding hospitalization.   This patient complains of shortness of breath wheezing; this involves an extensive number of treatment Options and is a complaint that carries with it a high risk of complications and morbidity. The differential includes asthma COPD pneumonia PE pneumothorax vascular anemia  I ordered, reviewed and interpreted labs, which included CBC with normal white count normal hemoglobin, chemistries fairly unremarkable, COVID-negative I ordered medication IV magnesium stacked nebs and reviewed PMP when indicated. I ordered imaging studies which included  chest x-ray and I independently    visualized and interpreted imaging which showed no acute findings Additional history obtained from EMS Previous records obtained and reviewed in epic including prior ED and PCP notes I consulted [facility] was Dr. Laural Benes and discussed lab and imaging findings and discussed disposition.  Cardiac monitoring reviewed, normal sinus rhythm Social determinants considered, no significant barriers Critical Interventions: Multiple reassessments and management of patient's respiratory distress  After the interventions stated above, I reevaluated the patient and found patient still to be dyspneic tachypneic wheezing  Admission and further testing considered, patient would benefit from mission to the hospital for further management.  She is in agreement with plan for admission.         Final Clinical Impression(s) / ED Diagnoses Final diagnoses:  Acute respiratory failure with hypoxia (HCC)  COPD with acute exacerbation [facility])    Rx / DC Orders ED Discharge Orders     None         Terrilee Files, MD 06/06/23 1722

## 2023-06-07 ENCOUNTER — Inpatient Hospital Stay (HOSPITAL_COMMUNITY): Payer: BC Managed Care – PPO

## 2023-06-07 DIAGNOSIS — Z9884 Bariatric surgery status: Secondary | ICD-10-CM

## 2023-06-07 DIAGNOSIS — Z6841 Body Mass Index (BMI) 40.0 and over, adult: Secondary | ICD-10-CM

## 2023-06-07 DIAGNOSIS — J439 Emphysema, unspecified: Secondary | ICD-10-CM | POA: Diagnosis not present

## 2023-06-07 DIAGNOSIS — J9601 Acute respiratory failure with hypoxia: Secondary | ICD-10-CM | POA: Diagnosis not present

## 2023-06-07 LAB — BASIC METABOLIC PANEL
Anion gap: 9 (ref 5–15)
BUN: 15 mg/dL (ref 6–20)
CO2: 24 mmol/L (ref 22–32)
Calcium: 9.3 mg/dL (ref 8.9–10.3)
Chloride: 106 mmol/L (ref 98–111)
Creatinine, Ser: 0.42 mg/dL — ABNORMAL LOW (ref 0.44–1.00)
GFR, Estimated: >60 mL/min (ref >60)
Glucose, Bld: 99 mg/dL (ref 70–99)
Potassium: 4.2 mmol/L (ref 3.5–5.1)
Sodium: 139 mmol/L (ref 135–145)

## 2023-06-07 LAB — MAGNESIUM: Magnesium: 2.5 mg/dL — ABNORMAL HIGH (ref 1.7–2.4)

## 2023-06-07 LAB — D-DIMER, QUANTITATIVE: D-Dimer, Quant: <0.27 ug/mL-FEU (ref 0.00–0.50)

## 2023-06-07 MED ORDER — ENOXAPARIN SODIUM 60 MG/0.6ML IJ SOSY
60.0000 mg | PREFILLED_SYRINGE | INTRAMUSCULAR | Status: DC
  Administered 2023-06-07 – 2023-06-08 (×2): 60 mg via SUBCUTANEOUS
  Filled 2023-06-07 (×2): qty 0.6

## 2023-06-07 MED ORDER — MULTIVITAMINS PO CAPS: 1.0000 | ORAL_CAPSULE | Freq: Every day | ORAL | Status: DC

## 2023-06-07 MED ORDER — ADULT MULTIVITAMIN W/MINERALS CH
1.0000 | ORAL_TABLET | Freq: Every day | ORAL | Status: DC
  Administered 2023-06-07 – 2023-06-08 (×2): 1 via ORAL
  Filled 2023-06-07 (×2): qty 1

## 2023-06-07 MED ORDER — PANTOPRAZOLE SODIUM 40 MG PO TBEC
40.0000 mg | DELAYED_RELEASE_TABLET | Freq: Every day | ORAL | Status: DC
  Administered 2023-06-07 – 2023-06-09 (×3): 40 mg via ORAL
  Filled 2023-06-07 (×3): qty 1

## 2023-06-07 MED ORDER — METHOCARBAMOL 500 MG PO TABS: 750.0000 mg | ORAL_TABLET | Freq: Four times a day (QID) | ORAL | Status: DC | PRN

## 2023-06-07 MED ORDER — CALCIUM CARBONATE 1250 (500 CA) MG PO TABS
1250.0000 mg | ORAL_TABLET | Freq: Two times a day (BID) | ORAL | Status: DC
  Administered 2023-06-07 – 2023-06-09 (×5): 1250 mg via ORAL
  Filled 2023-06-07 (×5): qty 1

## 2023-06-07 MED ORDER — DULOXETINE HCL 20 MG PO CPEP
20.0000 mg | ORAL_CAPSULE | Freq: Every day | ORAL | Status: DC
  Administered 2023-06-07 – 2023-06-08 (×2): 20 mg via ORAL
  Filled 2023-06-07 (×2): qty 1

## 2023-06-07 MED ORDER — GABAPENTIN 300 MG PO CAPS
300.0000 mg | ORAL_CAPSULE | Freq: Two times a day (BID) | ORAL | Status: DC
  Administered 2023-06-07 – 2023-06-09 (×5): 300 mg via ORAL
  Filled 2023-06-07 (×5): qty 1

## 2023-06-07 NOTE — Progress Notes (Signed)
Patient declined CPAP. States she doesn't use one at home. No unit in room at this time. 

## 2023-06-07 NOTE — TOC Initial Note (Signed)
Transition of Care [facility]) - Initial/Assessment Note    Patient Details  Name: Heather Mckinney MRN: [id number] Date of Birth: 06-Aug-1971  Transition of Care (TOC) CM/SW Contact:    Catalina Gravel, LCSW Phone Number: 06/07/2023, 4:01 PM  Clinical Narrative:                 Pt from home, arrived by EMS SOB. Watch for potential 02 requirement, continued medical work up. DC 2-3 days. TOC to follow.     Barriers to Discharge: Continued Medical Work up   Patient Goals and CMS Choice            Expected Discharge Plan and Services                                              Prior Living Arrangements/Services                       Activities of Daily Living Home Assistive Devices/Equipment: None ADL Screening (condition at time of admission) Patient's cognitive ability adequate to safely complete daily activities?: Yes Is the patient deaf or have difficulty hearing?: No Does the patient have difficulty seeing, even when wearing glasses/contacts?: No Does the patient have difficulty concentrating, remembering, or making decisions?: No Patient able to express need for assistance with ADLs?: Yes Does the patient have difficulty dressing or bathing?: No Independently performs ADLs?: Yes (appropriate for developmental age) Does the patient have difficulty walking or climbing stairs?: No Weakness of Legs: None Weakness of Arms/Hands: None  Permission Sought/Granted                  Emotional Assessment              Admission diagnosis:  Acute respiratory failure with hypoxia (HCC) [J96.01] Patient Active Problem List   Diagnosis Date Noted   Acute respiratory failure with hypoxia (HCC) 06/06/2023   History of Roux-en-Y gastric bypass 06/06/2023   Asthma with acute exacerbation 06/06/2023   Morbid obesity with BMI of 45.0-49.9, adult (HCC) 04/15/2023   Snoring 02/23/2020   Intractable episodic cluster headache 01/17/2020   Sleep related  headaches 01/17/2020   Class 3 severe obesity due to excess calories with serious comorbidity and body mass index (BMI) of 40.0 to 44.9 in adult (HCC) 01/17/2020   Obesity with alveolar hypoventilation and body mass index (BMI) of 40 or greater (HCC) 01/17/2020   Pulmonary emphysema (HCC) 01/17/2020   Chronic migraine without aura, with intractable migraine, so stated, with status migrainosus 01/03/2020   Migraine with aura and with status migrainosus, not intractable 01/03/2020   PCP:  [name], NP Pharmacy:   [facility] [phone] - [place], [place] - [address] AT [facility] [address] [place] [address] Phone: [phone] Fax: [phone]     Social Determinants of Health (SDOH) Social History: SDOH Screenings   Food Insecurity: No Food Insecurity (06/06/2023)  Housing: Low Risk  (06/06/2023)  Transportation Needs: No Transportation Needs (06/06/2023)  Utilities: Not At Risk (06/06/2023)  Depression (PHQ2-9): Low Risk  (01/27/2023)  Tobacco Use: Medium Risk (06/06/2023)   SDOH Interventions:     Readmission Risk Interventions     No data to display

## 2023-06-07 NOTE — Progress Notes (Signed)
PROGRESS NOTE   Heather Mckinney  ZOX:[id number] DOB: 26-Nov-1971 DOA: 06/06/2023 PCP: [name], NP   Chief Complaint  Patient presents with   Shortness of Breath   Level of care: Telemetry  Brief Admission History:  52  year old female with morbid obesity, childhood asthma, COPD from second-hand smoke exposure, recently status post robotic Roux-en-Y gastric bypass and hiatal hernia repair, anxiety, depression, fibromyalgia, GERD who had been doing well since surgery on 04/15/23.  She had started back working.  She has lost over 40 pounds since surgery.  She reports that she normally gets acute bronchitis twice per year.  She says that she started having cough, chest congestion, wheezing over the past couple of days and it only progressively got worse.  She had been taking bronchodilators at home but continued to have chest tightness. She denies chest pain.  She reports that she was at work today and started with severe wheezing and shortness of breath and had to leave work early. She was fatigued and felt miserable.  She was walking to her car and she became even more short of breath and she started having anxiety and a panic attack and she ended up calling EMS.  They found her to be hypoxic with a pulse ox in the 80s and they started her on supplemental oxygen and gave her a dose of solumedrol and brought her to the ED.  Her work up in the ED confirmed a new oxygen requirement.  No acute changes noted on EKG. She was diffusely wheezing and short of breath.  She received a continuous nebulizer treatment and still remained very symptomatic.  Admission to the hospital was requested for further management.     Assessment and Plan:  Acute respiratory failure with hypoxia -pt presenting with a new oxygen requirement -presumably secondary to severe COPD exacerbation  -we have been unable to wean oxygen to room air -proceed with further workup given recent major surgery, check D dimer, venous  dopplers of legs -if either study positive proceed with CTA chest to rule out PE   COPD acute exacerbation  -pt reports she got this from 2nd hand smoke exposure -treating aggressively with IV steroids -scheduled bronchodilators -adding scheduled mucolytics -added cough suppressant as needed -added oral doxycycline 100 mg BID   Morbid Obesity  -recently s/p robotic roux-n-Y with hiatal hernia repair -Pt has lost 40 pounds in last month -Bariatric diet ordered   Anxiety and depression  -Pt had a pretty severe panic attack earlier today but is better now -resumed all of her home behavioral health medications -added alprazolam PRN for severe symptoms  DVT prophylaxis: enoxaparin Code Status: Full  Family Communication: husband at bedside  Disposition: Status is: Inpatient   Consultants:   Procedures:   Antimicrobials:  Doxycycline    Subjective: Pt is still wheezing and coughing and short of breath, says her anxiety and panic is better today, no CP, no palpitations, SOB persists and not feeling back to baseline.    Objective: Vitals:   06/07/23 0345 06/07/23 0417 06/07/23 0722 06/07/23 0842  BP:  124/70  115/73  Pulse:  65  63  Resp:  (!) 22  20  Temp:  97.9 F (36.6 C)  97.8 F (36.6 C)  TempSrc:  Oral  Oral  SpO2: 97% 98% 96% 100%  Weight:      Height:        Intake/Output Summary (Last 24 hours) at 06/07/2023 1055 Last data filed at 06/07/2023 0845 Gross  per 24 hour  Intake 525.17 ml  Output --  Net 525.17 ml   Filed Weights   06/06/23 1102 06/06/23 1429  Weight: 125.6 kg 125.6 kg   Examination:  General exam: obese, awake, alert, cooperative, Appears calm and comfortable  Respiratory system: diffuse expiratory wheezes heard bilaterally with no crackles heard.  Cardiovascular system: normal S1 & S2 heard. No JVD, murmurs, rubs, gallops or clicks. No pedal edema. Gastrointestinal system: Abdomen is nondistended, soft and nontender. No organomegaly or  masses felt. Normal bowel sounds heard. Central nervous system: Alert and oriented. No focal neurological deficits. Extremities: Symmetric 5 x 5 power. Skin: No rashes, lesions or ulcers. Psychiatry: Judgement and insight appear normal. Mood & affect appropriate.   Data Reviewed: I have personally reviewed following labs and imaging studies  CBC: Recent Labs  Lab 06/06/23 1206  WBC 10.5  NEUTROABS 8.5*  HGB 12.7  HCT 39.5  MCV 94.5  PLT 290    Basic Metabolic Panel: Recent Labs  Lab 06/06/23 1206 06/07/23 0427  NA 138 139  K 3.8 4.2  CL 106 106  CO2 25 24  GLUCOSE 123* 99  BUN 23* 15  CREATININE 0.51 0.42*  CALCIUM 9.2 9.3  MG  --  2.5*    CBG: No results for input(s): "GLUCAP" in the last 168 hours.  Recent Results (from the past 240 hour(s))  SARS Coronavirus 2 by RT PCR (hospital order, performed in [facility] hospital lab) *cepheid single result test* Anterior Nasal Swab     Status: None   Collection Time: 06/06/23 12:54 PM   Specimen: Anterior Nasal Swab  Result Value Ref Range Status   SARS Coronavirus 2 by RT PCR NEGATIVE NEGATIVE Final    Comment: (NOTE) SARS-CoV-2 target nucleic acids are NOT DETECTED.  The SARS-CoV-2 RNA is generally detectable in upper and lower respiratory specimens during the acute phase of infection. The lowest concentration of SARS-CoV-2 viral copies this assay can detect is 250 copies / mL. A negative result does not preclude SARS-CoV-2 infection and should not be used as the sole basis for treatment or other patient management decisions.  A negative result may occur with improper specimen collection / handling, submission of specimen other than nasopharyngeal swab, presence of viral mutation(s) within the areas targeted by this assay, and inadequate number of viral copies (<250 copies / mL). A negative result must be combined with clinical observations, patient history, and epidemiological information.  Fact Sheet for  Patients:   RoadLapTop.co.za  Fact Sheet for Healthcare Providers: http://kim-miller.com/  This test is not yet approved or  cleared by the [place] FDA and has been authorized for detection and/or diagnosis of SARS-CoV-2 by FDA under an Emergency Use Authorization (EUA).  This EUA will remain in effect (meaning this test can be used) for the duration of the COVID-19 declaration under Section 564(b)(1) of the Act, 21 U.S.C. section 360bbb-3(b)(1), unless the authorization is terminated or revoked sooner.  Performed at [facility], [address], [place], [place] [address]      Radiology Studies: [facility] Chest [facility] 1 View  Result Date: 06/06/2023 CLINICAL DATA:  Shortness of breath EXAM: PORTABLE CHEST 1 VIEW COMPARISON:  05/29/2023 FINDINGS: The patient is rotated to the left on today's radiograph, reducing diagnostic sensitivity and specificity. Apical lordotic projection. Cardiac and mediastinal margins appear normal. Accounting for the leftward rotation, the lungs appear clear. No blunting of the costophrenic angles. No acute bony abnormality. IMPRESSION: 1. No acute findings. 2. Leftward  rotation of the patient on today's radiograph. Electronically Signed   By: Gaylyn Rong M.D.   On: 06/06/2023 11:52    Scheduled Meds:  calcium carbonate  1,250 mg Oral BID   doxycycline  100 mg Oral Q12H   DULoxetine  20 mg Oral QHS   enoxaparin (LOVENOX) injection  60 mg Subcutaneous Q24H   gabapentin  300 mg Oral BID   ipratropium-albuterol  3 mL Nebulization Q4H   methylPREDNISolone (SOLU-MEDROL) injection  120 mg Intravenous Q0600   multivitamin with minerals  1 tablet Oral QHS   pantoprazole  40 mg Oral Daily   Continuous Infusions:   LOS: 1 day   Time spent: 36 mins  Webb Weed Laural Benes, MD How to contact the [facility] Attending or Consulting provider 7A - 7P or covering provider during after hours 7P -7A, for this patient?  Check the care  team in [facility] and look for a) attending/consulting [facility] provider listed and b) the [facility] team listed Log into [email or web address] and use [facility] universal password to access. If you do not have the password, please contact the hospital operator. Locate the [facility] provider you are looking for under [facility] and page to a number that you can be directly reached. If you still have difficulty reaching the provider, please page the [facility] (Director on Call) for the Hospitalists listed on amion for assistance.  06/07/2023, 10:55 AM

## 2023-06-08 DIAGNOSIS — Z9884 Bariatric surgery status: Secondary | ICD-10-CM | POA: Diagnosis not present

## 2023-06-08 DIAGNOSIS — J4521 Mild intermittent asthma with (acute) exacerbation: Secondary | ICD-10-CM | POA: Diagnosis not present

## 2023-06-08 DIAGNOSIS — J9601 Acute respiratory failure with hypoxia: Secondary | ICD-10-CM | POA: Diagnosis not present

## 2023-06-08 DIAGNOSIS — J439 Emphysema, unspecified: Secondary | ICD-10-CM | POA: Diagnosis not present

## 2023-06-08 MED ORDER — GUAIFENESIN ER 600 MG PO TB12
1200.0000 mg | ORAL_TABLET | Freq: Two times a day (BID) | ORAL | Status: DC
  Administered 2023-06-08 – 2023-06-09 (×3): 1200 mg via ORAL
  Filled 2023-06-08 (×4): qty 2

## 2023-06-08 MED ORDER — DEXTROMETHORPHAN POLISTIREX ER 30 MG/5ML PO SUER
30.0000 mg | Freq: Two times a day (BID) | ORAL | Status: DC | PRN
  Administered 2023-06-08 – 2023-06-09 (×2): 30 mg via ORAL
  Filled 2023-06-08 (×2): qty 5

## 2023-06-08 MED ORDER — METHYLPREDNISOLONE SODIUM SUCC 125 MG IJ SOLR
125.0000 mg | Freq: Every day | INTRAMUSCULAR | Status: DC
  Administered 2023-06-08: 125 mg via INTRAVENOUS
  Filled 2023-06-08: qty 2

## 2023-06-08 NOTE — Progress Notes (Signed)
PROGRESS NOTE   Heather Mckinney  ZOX:[id number] DOB: 1971/12/05 DOA: 06/06/2023 PCP: [name], NP   Chief Complaint  Patient presents with   Shortness of Breath   Level of care: Telemetry  Brief Admission History:  52  year old female with morbid obesity, childhood asthma, COPD from second-hand smoke exposure, recently status post robotic Roux-en-Y gastric bypass and hiatal hernia repair, anxiety, depression, fibromyalgia, GERD who had been doing well since surgery on 04/15/23.  She had started back working.  She has lost over 40 pounds since surgery.  She reports that she normally gets acute bronchitis twice per year.  She says that she started having cough, chest congestion, wheezing over the past couple of days and it only progressively got worse.  She had been taking bronchodilators at home but continued to have chest tightness. She denies chest pain.  She reports that she was at work today and started with severe wheezing and shortness of breath and had to leave work early. She was fatigued and felt miserable.  She was walking to her car and she became even more short of breath and she started having anxiety and a panic attack and she ended up calling EMS.  They found her to be hypoxic with a pulse ox in the 80s and they started her on supplemental oxygen and gave her a dose of solumedrol and brought her to the ED.  Her work up in the ED confirmed a new oxygen requirement.  No acute changes noted on EKG. She was diffusely wheezing and short of breath.  She received a continuous nebulizer treatment and still remained very symptomatic.  Admission to the hospital was requested for further management.     Assessment and Plan:  Acute respiratory failure with hypoxia -pt presenting with a new oxygen requirement -presumably secondary to severe COPD exacerbation  -we have been unable to wean oxygen to room air -proceeded with further workup given recent major surgery, D dimer, venous dopplers  of legs negative and reassuring -wean oxygen as able, remains on 3L/min, goal wean to 2L and then wean to room air oxygen   COPD acute exacerbation  -pt reports she got this from 2nd hand smoke exposure -treating aggressively with IV steroids -scheduled bronchodilators -adding scheduled mucolytics -added cough suppressant as needed -added oral doxycycline 100 mg BID -add flutter valve    Morbid Obesity  -recently s/p robotic roux-n-Y with hiatal hernia repair -Pt has lost 40 pounds in last month -Bariatric diet ordered   Anxiety and depression  -Pt had a pretty severe panic attack earlier but is better now -resumed all of her home behavioral health medications -added alprazolam PRN for severe symptoms  DVT prophylaxis: enoxaparin Code Status: Full  Family Communication: husband at bedside  Disposition: Status is: Inpatient   Consultants:   Procedures:   Antimicrobials:  Doxycycline    Subjective: Pt finally starting to notice some meaningful improvement, still with course wheezing, not able to expectorate, using incentive spirometry at bedside.    Objective: Vitals:   06/07/23 2311 06/08/23 0415 06/08/23 0520 06/08/23 0903  BP:   (!) 101/56   Pulse:   70   Resp:   18   Temp:   (!) 97.5 F (36.4 C)   TempSrc:   Oral   SpO2: 93% 95% 93% 97%  Weight:      Height:        Intake/Output Summary (Last 24 hours) at 06/08/2023 1157 Last data filed at 06/08/2023 0500 Gross  per 24 hour  Intake 920 ml  Output --  Net 920 ml   Filed Weights   06/06/23 1102 06/06/23 1429  Weight: 125.6 kg 125.6 kg   Examination:  General exam: obese, awake, alert, cooperative, Appears calm and comfortable  Respiratory system: improving expiratory wheezes heard bilaterally with no crackles heard. Better air volume and movement on my exam than yesterday. Cardiovascular system: normal S1 & S2 heard. No JVD, murmurs, rubs, gallops or clicks. No pedal edema. Gastrointestinal system:  Abdomen is nondistended, soft and nontender. No organomegaly or masses felt. Normal bowel sounds heard. Central nervous system: Alert and oriented. No focal neurological deficits. Extremities: Symmetric 5 x 5 power. Skin: No rashes, lesions or ulcers. Psychiatry: Judgement and insight appear normal. Mood & affect appropriate.   Data Reviewed: I have personally reviewed following labs and imaging studies  CBC: Recent Labs  Lab 06/06/23 1206  WBC 10.5  NEUTROABS 8.5*  HGB 12.7  HCT 39.5  MCV 94.5  PLT 290    Basic Metabolic Panel: Recent Labs  Lab 06/06/23 1206 06/07/23 0427  NA 138 139  K 3.8 4.2  CL 106 106  CO2 25 24  GLUCOSE 123* 99  BUN 23* 15  CREATININE 0.51 0.42*  CALCIUM 9.2 9.3  MG  --  2.5*    CBG: No results for input(s): "GLUCAP" in the last 168 hours.  Recent Results (from the past 240 hour(s))  SARS Coronavirus 2 by RT PCR (hospital order, performed in [facility] hospital lab) *cepheid single result test* Anterior Nasal Swab     Status: None   Collection Time: 06/06/23 12:54 PM   Specimen: Anterior Nasal Swab  Result Value Ref Range Status   SARS Coronavirus 2 by RT PCR NEGATIVE NEGATIVE Final    Comment: (NOTE) SARS-CoV-2 target nucleic acids are NOT DETECTED.  The SARS-CoV-2 RNA is generally detectable in upper and lower respiratory specimens during the acute phase of infection. The lowest concentration of SARS-CoV-2 viral copies this assay can detect is 250 copies / mL. A negative result does not preclude SARS-CoV-2 infection and should not be used as the sole basis for treatment or other patient management decisions.  A negative result may occur with improper specimen collection / handling, submission of specimen other than nasopharyngeal swab, presence of viral mutation(s) within the areas targeted by this assay, and inadequate number of viral copies (<250 copies / mL). A negative result must be combined with clinical observations, patient  history, and epidemiological information.  Fact Sheet for Patients:   RoadLapTop.co.za  Fact Sheet for Healthcare Providers: http://kim-miller.com/  This test is not yet approved or  cleared by the [place] FDA and has been authorized for detection and/or diagnosis of SARS-CoV-2 by FDA under an Emergency Use Authorization (EUA).  This EUA will remain in effect (meaning this test can be used) for the duration of the COVID-19 declaration under Section 564(b)(1) of the Act, 21 U.S.C. section 360bbb-3(b)(1), unless the authorization is terminated or revoked sooner.  Performed at [facility], [address], [place], [place] [address]      Radiology Studies: US Venous Img Lower Bilateral (DVT)  Result Date: 06/07/2023 CLINICAL DATA:  Bilateral lower extremity edema EXAM: BILATERAL LOWER EXTREMITY VENOUS DOPPLER ULTRASOUND TECHNIQUE: Gray-scale sonography with graded compression, as well as color Doppler and duplex ultrasound were performed to evaluate the lower extremity deep venous systems from the level of the common femoral vein and including the common femoral, femoral, profunda femoral, popliteal  and calf veins including the posterior tibial, peroneal and gastrocnemius veins when visible. The superficial great saphenous vein was also interrogated. Spectral Doppler was utilized to evaluate flow at rest and with distal augmentation maneuvers in the common femoral, femoral and popliteal veins. COMPARISON:  None Available. FINDINGS: RIGHT LOWER EXTREMITY Common Femoral Vein: No evidence of thrombus. Normal compressibility, respiratory phasicity and response to augmentation. Saphenofemoral Junction: No evidence of thrombus. Normal compressibility and flow on color Doppler imaging. Profunda Femoral Vein: No evidence of thrombus. Normal compressibility and flow on color Doppler imaging. Femoral Vein: No evidence of thrombus. Normal compressibility,  respiratory phasicity and response to augmentation. Popliteal Vein: No evidence of thrombus. Normal compressibility, respiratory phasicity and response to augmentation. Calf Veins: No evidence of thrombus. Normal compressibility and flow on color Doppler imaging. Superficial Great Saphenous Vein: No evidence of thrombus. Normal compressibility. Venous Reflux:  None. Other Findings:  None. LEFT LOWER EXTREMITY Common Femoral Vein: No evidence of thrombus. Normal compressibility, respiratory phasicity and response to augmentation. Saphenofemoral Junction: No evidence of thrombus. Normal compressibility and flow on color Doppler imaging. Profunda Femoral Vein: No evidence of thrombus. Normal compressibility and flow on color Doppler imaging. Femoral Vein: No evidence of thrombus. Normal compressibility, respiratory phasicity and response to augmentation. Popliteal Vein: No evidence of thrombus. Normal compressibility, respiratory phasicity and response to augmentation. Calf Veins: No evidence of thrombus. Normal compressibility and flow on color Doppler imaging. Superficial Great Saphenous Vein: No evidence of thrombus. Normal compressibility. Venous Reflux:  None. Other Findings:  None. IMPRESSION: No evidence of deep venous thrombosis in either lower extremity. Electronically Signed   By: Malachy Moan M.D.   On: 06/07/2023 12:50    Scheduled Meds:  calcium carbonate  1,250 mg Oral BID   doxycycline  100 mg Oral Q12H   DULoxetine  20 mg Oral QHS   enoxaparin (LOVENOX) injection  60 mg Subcutaneous Q24H   gabapentin  300 mg Oral BID   guaiFENesin  1,200 mg Oral BID   ipratropium-albuterol  3 mL Nebulization Q4H   methylPREDNISolone (SOLU-MEDROL) injection  125 mg Intravenous Q0600   multivitamin with minerals  1 tablet Oral QHS   pantoprazole  40 mg Oral Daily   Continuous Infusions:   LOS: 2 days   Time spent: 35 mins  Sharnice Bosler Laural Benes, MD How to contact the [facility] Attending or Consulting  provider 7A - 7P or covering provider during after hours 7P -7A, for this patient?  Check the care team in [facility] and look for a) attending/consulting [facility] provider listed and b) the [facility] team listed Log into [email or web address] and use [place]'s universal password to access. If you do not have the password, please contact the hospital operator. Locate the [facility] provider you are looking for under [facility] and page to a number that you can be directly reached. If you still have difficulty reaching the provider, please page the [employer] (Director on Call) for the Hospitalists listed on amion for assistance.  06/08/2023, 11:57 AM

## 2023-06-09 DIAGNOSIS — J439 Emphysema, unspecified: Secondary | ICD-10-CM | POA: Diagnosis not present

## 2023-06-09 DIAGNOSIS — J441 Chronic obstructive pulmonary disease with (acute) exacerbation: Secondary | ICD-10-CM

## 2023-06-09 DIAGNOSIS — Z9884 Bariatric surgery status: Secondary | ICD-10-CM | POA: Diagnosis not present

## 2023-06-09 DIAGNOSIS — J9601 Acute respiratory failure with hypoxia: Secondary | ICD-10-CM | POA: Diagnosis not present

## 2023-06-09 MED ORDER — GUAIFENESIN ER 600 MG PO TB12: 1200.0000 mg | ORAL_TABLET | Freq: Two times a day (BID) | ORAL | 0 refills | Status: AC

## 2023-06-09 MED ORDER — ALBUTEROL SULFATE HFA 108 (90 BASE) MCG/ACT IN AERS: 1.0000 | INHALATION_SPRAY | Freq: Four times a day (QID) | RESPIRATORY_TRACT | 2 refills | Status: AC | PRN

## 2023-06-09 MED ORDER — DOXYCYCLINE HYCLATE 100 MG PO TABS: 100.0000 mg | ORAL_TABLET | Freq: Two times a day (BID) | ORAL | 0 refills | Status: AC

## 2023-06-09 MED ORDER — DEXTROMETHORPHAN POLISTIREX ER 30 MG/5ML PO SUER: 30.0000 mg | Freq: Two times a day (BID) | ORAL | 0 refills | Status: AC | PRN

## 2023-06-09 MED ORDER — IPRATROPIUM-ALBUTEROL 0.5-2.5 (3) MG/3ML IN SOLN
3.0000 mL | Freq: Four times a day (QID) | RESPIRATORY_TRACT | Status: DC
  Administered 2023-06-09: 3 mL via RESPIRATORY_TRACT
  Filled 2023-06-09: qty 3

## 2023-06-09 MED ORDER — PREDNISONE 20 MG PO TABS: 40.0000 mg | ORAL_TABLET | Freq: Every day | ORAL | 0 refills | Status: AC

## 2023-06-09 MED ORDER — IPRATROPIUM-ALBUTEROL 0.5-2.5 (3) MG/3ML IN SOLN: 3.0000 mL | RESPIRATORY_TRACT | 1 refills | Status: AC | PRN

## 2023-06-09 NOTE — Discharge Summary (Signed)
Physician Discharge Summary  Jessah Luyster ZOX:[id number] DOB: 23-Oct-1971 DOA: 06/06/2023  PCP: [name], NP  Admit date: 06/06/2023 Discharge date: 06/09/2023  Admitted From:  Home  Disposition: Home   Recommendations for Outpatient Follow-up:  Follow up with PCP in 1 weeks  Home Health: n/a   Discharge Condition: STABLE   CODE STATUS: FULL DIET: Bariatric Diet    Brief Hospitalization Summary: Please see all hospital notes, images, labs for full details of the hospitalization. 52  year old female with morbid obesity, childhood asthma, COPD from second-hand smoke exposure, recently status post robotic Roux-en-Y gastric bypass and hiatal hernia repair, anxiety, depression, fibromyalgia, GERD who had been doing well since surgery on 04/15/23.  She had started back working.  She has lost over 40 pounds since surgery.  She reports that she normally gets acute bronchitis twice per year.  She says that she started having cough, chest congestion, wheezing over the past couple of days and it only progressively got worse.  She had been taking bronchodilators at home but continued to have chest tightness. She denies chest pain.  She reports that she was at work today and started with severe wheezing and shortness of breath and had to leave work early. She was fatigued and felt miserable.  She was walking to her car and she became even more short of breath and she started having anxiety and a panic attack and she ended up calling EMS.  They found her to be hypoxic with a pulse ox in the 80s and they started her on supplemental oxygen and gave her a dose of solumedrol and brought her to the ED.  Her work up in the ED confirmed a new oxygen requirement.  No acute changes noted on EKG. She was diffusely wheezing and short of breath.  She received a continuous nebulizer treatment and still remained very symptomatic.  Admission to the hospital was requested for further management.    Hospital Course by  problem list  Acute respiratory failure with hypoxia - RESOLVED after treatment  -pt presenting with a new oxygen requirement -presumably secondary to severe COPD exacerbation  -we have been unable to wean oxygen to room air -proceeded with further workup given recent major surgery, D dimer, venous dopplers of legs negative and reassuring -weaned to room air oxygen   COPD acute exacerbation - much improved  -pt reported she got this from 2nd hand smoke exposure -treating aggressively with IV steroids, wean to oral prednisone at DC -scheduled bronchodilators -adding scheduled mucolytics -added cough suppressant as needed -added oral doxycycline 100 mg BID x 3 days -added flutter valve  -DC home today with home nebulizer, Duonebs PRN, finish up 4 more days of oral prednisone   Morbid Obesity  -recently s/p robotic roux-n-Y with hiatal hernia repair -Pt has lost 40 pounds in last month -Bariatric diet ordered   Anxiety and depression  -Pt had a pretty severe panic attack earlier but is resolved now -resumed all of her home behavioral health medications -added alprazolam PRN for severe symptoms in hospital only -symptoms have resolved now  Discharge Diagnoses:  Principal Problem:   Acute respiratory failure with hypoxia (HCC) Active Problems:   Sleep related headaches   Pulmonary emphysema (HCC)   Morbid obesity with BMI of 45.0-49.9, adult (HCC)   History of Roux-en-Y gastric bypass   Asthma with acute exacerbation   Discharge Instructions:  Allergies as of 06/09/2023       Reactions   Ephedrine Other (See Comments)  Patient states she went into cardiac arrest after taking sudafed.   Aspirin Other (See Comments)   Hearing loss         Medication List     STOP taking these medications    diclofenac 75 MG EC tablet Commonly known as: VOLTAREN       TAKE these medications    albuterol 108 (90 Base) MCG/ACT inhaler Commonly known as: VENTOLIN HFA Inhale  1-2 puffs into the lungs every 6 (six) hours as needed for wheezing or shortness of breath.   calcium carbonate 1250 (500 Ca) MG chewable tablet Commonly known as: OS-CAL Chew 1 tablet by mouth daily.   dextromethorphan 30 MG/5ML liquid Commonly known as: DELSYM Take 5 mLs (30 mg total) by mouth 2 (two) times daily as needed for cough.   doxycycline 100 MG tablet Commonly known as: VIBRA-TABS Take 1 tablet (100 mg total) by mouth every 12 (twelve) hours for 3 days.   DULoxetine 20 MG capsule Commonly known as: CYMBALTA Take 20 mg by mouth daily.   gabapentin 300 MG capsule Commonly known as: NEURONTIN Take 300 mg by mouth 2 (two) times daily.   guaiFENesin 600 MG 12 hr tablet Commonly known as: MUCINEX Take 2 tablets (1,200 mg total) by mouth 2 (two) times daily for 3 days.   ipratropium-albuterol 0.5-2.5 (3) MG/3ML Soln Commonly known as: DUONEB Take 3 mLs by nebulization every 4 (four) hours as needed (wheezing, coughing, SOB).   methocarbamol 750 MG tablet Commonly known as: Robaxin-750 Take 1 tablet (750 mg total) by mouth every 6 (six) hours as needed for muscle spasms.   multivitamin capsule Take 1 capsule by mouth daily.   ondansetron 4 MG disintegrating tablet Commonly known as: ZOFRAN-ODT Take 1 tablet (4 mg total) by mouth every 6 (six) hours as needed for nausea or vomiting.   pantoprazole 40 MG tablet Commonly known as: PROTONIX Take 1 tablet (40 mg total) by mouth daily.   predniSONE 20 MG tablet Commonly known as: DELTASONE Take 2 tablets (40 mg total) by mouth daily with breakfast for 4 days. Start taking on: June 10, 2023               Durable Medical Equipment  (From admission, onward)           Start     Ordered   06/09/23 0730  For home use only DME Nebulizer machine  Once       Question Answer Comment  Patient needs a nebulizer to treat with the following condition COPD (chronic obstructive pulmonary disease) (HCC)   Length of  Need Lifetime      06/09/23 0729            Follow-up Information     [name], NP. Schedule an appointment as soon as possible for a visit in 1 week(s).   Specialty: Adult Health Nurse Practitioner Why: Hospital Follow Up Contact information: [address] [place] [address] [phone]                Allergies  Allergen Reactions   Ephedrine Other (See Comments)    Patient states she went into cardiac arrest after taking sudafed.   Aspirin Other (See Comments)    Hearing loss    Allergies as of 06/09/2023       Reactions   Ephedrine Other (See Comments)   Patient states she went into cardiac arrest after taking sudafed.   Aspirin Other (See  Comments)   Hearing loss         Medication List     STOP taking these medications    diclofenac 75 MG EC tablet Commonly known as: VOLTAREN       TAKE these medications    albuterol 108 (90 Base) MCG/ACT inhaler Commonly known as: VENTOLIN HFA Inhale 1-2 puffs into the lungs every 6 (six) hours as needed for wheezing or shortness of breath.   calcium carbonate 1250 (500 Ca) MG chewable tablet Commonly known as: OS-CAL Chew 1 tablet by mouth daily.   dextromethorphan 30 MG/5ML liquid Commonly known as: DELSYM Take 5 mLs (30 mg total) by mouth 2 (two) times daily as needed for cough.   doxycycline 100 MG tablet Commonly known as: VIBRA-TABS Take 1 tablet (100 mg total) by mouth every 12 (twelve) hours for 3 days.   DULoxetine 20 MG capsule Commonly known as: CYMBALTA Take 20 mg by mouth daily.   gabapentin 300 MG capsule Commonly known as: NEURONTIN Take 300 mg by mouth 2 (two) times daily.   guaiFENesin 600 MG 12 hr tablet Commonly known as: MUCINEX Take 2 tablets (1,200 mg total) by mouth 2 (two) times daily for 3 days.   ipratropium-albuterol 0.5-2.5 (3) MG/3ML Soln Commonly known as: DUONEB Take 3 mLs by nebulization every 4 (four) hours as needed (wheezing,  coughing, SOB).   methocarbamol 750 MG tablet Commonly known as: Robaxin-750 Take 1 tablet (750 mg total) by mouth every 6 (six) hours as needed for muscle spasms.   multivitamin capsule Take 1 capsule by mouth daily.   ondansetron 4 MG disintegrating tablet Commonly known as: ZOFRAN-ODT Take 1 tablet (4 mg total) by mouth every 6 (six) hours as needed for nausea or vomiting.   pantoprazole 40 MG tablet Commonly known as: PROTONIX Take 1 tablet (40 mg total) by mouth daily.   predniSONE 20 MG tablet Commonly known as: DELTASONE Take 2 tablets (40 mg total) by mouth daily with breakfast for 4 days. Start taking on: June 10, 2023               Durable Medical Equipment  (From admission, onward)           Start     Ordered   06/09/23 0730  For home use only DME Nebulizer machine  Once       Question Answer Comment  Patient needs a nebulizer to treat with the following condition COPD (chronic obstructive pulmonary disease) (HCC)   Length of Need Lifetime      06/09/23 1610            Procedures/Studies: US Venous Img Lower Bilateral (DVT)  Result Date: 06/07/2023 CLINICAL DATA:  Bilateral lower extremity edema EXAM: BILATERAL LOWER EXTREMITY VENOUS DOPPLER ULTRASOUND TECHNIQUE: Gray-scale sonography with graded compression, as well as color Doppler and duplex ultrasound were performed to evaluate the lower extremity deep venous systems from the level of the common femoral vein and including the common femoral, femoral, profunda femoral, popliteal and calf veins including the posterior tibial, peroneal and gastrocnemius veins when visible. The superficial great saphenous vein was also interrogated. Spectral Doppler was utilized to evaluate flow at rest and with distal augmentation maneuvers in the common femoral, femoral and popliteal veins. COMPARISON:  None Available. FINDINGS: RIGHT LOWER EXTREMITY Common Femoral Vein: No evidence of thrombus. Normal  compressibility, respiratory phasicity and response to augmentation. Saphenofemoral Junction: No evidence of thrombus. Normal compressibility and flow on color Doppler imaging. Profunda Femoral  Vein: No evidence of thrombus. Normal compressibility and flow on color Doppler imaging. Femoral Vein: No evidence of thrombus. Normal compressibility, respiratory phasicity and response to augmentation. Popliteal Vein: No evidence of thrombus. Normal compressibility, respiratory phasicity and response to augmentation. Calf Veins: No evidence of thrombus. Normal compressibility and flow on color Doppler imaging. Superficial Great Saphenous Vein: No evidence of thrombus. Normal compressibility. Venous Reflux:  None. Other Findings:  None. LEFT LOWER EXTREMITY Common Femoral Vein: No evidence of thrombus. Normal compressibility, respiratory phasicity and response to augmentation. Saphenofemoral Junction: No evidence of thrombus. Normal compressibility and flow on color Doppler imaging. Profunda Femoral Vein: No evidence of thrombus. Normal compressibility and flow on color Doppler imaging. Femoral Vein: No evidence of thrombus. Normal compressibility, respiratory phasicity and response to augmentation. Popliteal Vein: No evidence of thrombus. Normal compressibility, respiratory phasicity and response to augmentation. Calf Veins: No evidence of thrombus. Normal compressibility and flow on color Doppler imaging. Superficial Great Saphenous Vein: No evidence of thrombus. Normal compressibility. Venous Reflux:  None. Other Findings:  None. IMPRESSION: No evidence of deep venous thrombosis in either lower extremity. Electronically Signed   By: Malachy Moan M.D.   On: 06/07/2023 12:50   DG Chest Port 1 View  Result Date: 06/06/2023 CLINICAL DATA:  Shortness of breath EXAM: PORTABLE CHEST 1 VIEW COMPARISON:  05/29/2023 FINDINGS: The patient is rotated to the left on today's radiograph, reducing diagnostic sensitivity and  specificity. Apical lordotic projection. Cardiac and mediastinal margins appear normal. Accounting for the leftward rotation, the lungs appear clear. No blunting of the costophrenic angles. No acute bony abnormality. IMPRESSION: 1. No acute findings. 2. Leftward rotation of the patient on today's radiograph. Electronically Signed   By: Gaylyn Rong M.D.   On: 06/06/2023 11:52   DG Chest 2 View  Result Date: 05/29/2023 CLINICAL DATA:  Cough and wheezing for 2 days. Vomiting today. Status post gastric bypass 03/2023. EXAM: CHEST - 2 VIEW COMPARISON:  Chest radiographs 01/27/2023 and 02/23/2019 FINDINGS: Cardiac silhouette and mediastinal contours are within normal limits. Mild right-greater-than-left lower lung interstitial thickening appears unchanged from multiple prior radiographs, likely chronic scarring. No acute airspace opacity is seen. No pleural effusion pneumothorax. Mild-to-moderate multilevel degenerative disc changes of the thoracic spine. Cholecystectomy clips. IMPRESSION: Mild right-greater-than-left lower lung interstitial thickening appears unchanged from multiple prior radiographs, likely chronic scarring. No acute lung process. Electronically Signed   By: Neita Garnet M.D.   On: 05/29/2023 15:04     Subjective: Pt has really improved and now back on room air oxygen and much better breathing.  No CP.    Discharge Exam: Vitals:   06/09/23 0409 06/09/23 0700  BP: 112/62   Pulse: 69   Resp: 20   Temp: (!) 97.4 F (36.3 C)   SpO2: 93% 94%   Vitals:   06/08/23 2334 06/09/23 0357 06/09/23 0409 06/09/23 0700  BP:   112/62   Pulse:   69   Resp:   20   Temp:   (!) 97.4 F (36.3 C)   TempSrc:   Oral   SpO2: (!) 89% 95% 93% 94%  Weight:      Height:       General: Pt is alert, awake, not in acute distress Cardiovascular: normal S1/S2 +, no rubs, no gallops Respiratory: CTA bilaterally, no wheezing, no rhonchi Abdominal: Soft, NT, ND, bowel sounds + Extremities: no  edema, no cyanosis   The results of significant diagnostics from this hospitalization (including imaging, microbiology, ancillary and  laboratory) are listed below for reference.     Microbiology: Recent Results (from the past 240 hour(s))  SARS Coronavirus 2 by RT PCR (hospital order, performed in [facility] hospital lab) *cepheid single result test* Anterior Nasal Swab     Status: None   Collection Time: 06/06/23 12:54 PM   Specimen: Anterior Nasal Swab  Result Value Ref Range Status   SARS Coronavirus 2 by RT PCR NEGATIVE NEGATIVE Final    Comment: (NOTE) SARS-CoV-2 target nucleic acids are NOT DETECTED.  The SARS-CoV-2 RNA is generally detectable in upper and lower respiratory specimens during the acute phase of infection. The lowest concentration of SARS-CoV-2 viral copies this assay can detect is 250 copies / mL. A negative result does not preclude SARS-CoV-2 infection and should not be used as the sole basis for treatment or other patient management decisions.  A negative result may occur with improper specimen collection / handling, submission of specimen other than nasopharyngeal swab, presence of viral mutation(s) within the areas targeted by this assay, and inadequate number of viral copies (<250 copies / mL). A negative result must be combined with clinical observations, patient history, and epidemiological information.  Fact Sheet for Patients:   RoadLapTop.co.za  Fact Sheet for Healthcare Providers: http://kim-miller.com/  This test is not yet approved or  cleared by the [place] FDA and has been authorized for detection and/or diagnosis of SARS-CoV-2 by FDA under an Emergency Use Authorization (EUA).  This EUA will remain in effect (meaning this test can be used) for the duration of the COVID-19 declaration under Section 564(b)(1) of the Act, 21 U.S.C. section 360bbb-3(b)(1), unless the authorization is terminated  or revoked sooner.  Performed at [facility], [address], [place], [place] [address]      Labs: BNP (last 3 results) No results for input(s): "BNP" in the last 8760 hours. Basic Metabolic Panel: Recent Labs  Lab 06/06/23 1206 06/07/23 0427  NA 138 139  K 3.8 4.2  CL 106 106  CO2 25 24  GLUCOSE 123* 99  BUN 23* 15  CREATININE 0.51 0.42*  CALCIUM 9.2 9.3  MG  --  2.5*   Liver Function Tests: No results for input(s): "AST", "ALT", "ALKPHOS", "BILITOT", "PROT", "ALBUMIN" in the last 168 hours. No results for input(s): "LIPASE", "AMYLASE" in the last 168 hours. No results for input(s): "AMMONIA" in the last 168 hours. CBC: Recent Labs  Lab 06/06/23 1206  WBC 10.5  NEUTROABS 8.5*  HGB 12.7  HCT 39.5  MCV 94.5  PLT 290   Cardiac Enzymes: No results for input(s): "CKTOTAL", "CKMB", "CKMBINDEX", "TROPONINI" in the last 168 hours. BNP: Invalid input(s): "POCBNP" CBG: No results for input(s): "GLUCAP" in the last 168 hours. D-Dimer Recent Labs    06/07/23 1116  DDIMER <0.27   Hgb A1c No results for input(s): "HGBA1C" in the last 72 hours. Lipid Profile No results for input(s): "CHOL", "HDL", "LDLCALC", "TRIG", "CHOLHDL", "LDLDIRECT" in the last 72 hours. Thyroid function studies No results for input(s): "TSH", "T4TOTAL", "T3FREE", "THYROIDAB" in the last 72 hours.  Invalid input(s): "FREET3" Anemia work up No results for input(s): "VITAMINB12", "FOLATE", "FERRITIN", "TIBC", "IRON", "RETICCTPCT" in the last 72 hours. Urinalysis No results found for: "COLORURINE", "APPEARANCEUR", "LABSPEC", "PHURINE", "GLUCOSEU", "HGBUR", "BILIRUBINUR", "KETONESUR", "PROTEINUR", "UROBILINOGEN", "NITRITE", "LEUKOCYTESUR" Sepsis Labs Recent Labs  Lab 06/06/23 1206  WBC 10.5   Microbiology Recent Results (from the past 240 hour(s))  SARS Coronavirus 2 by RT PCR (hospital order, performed in [facility] hospital lab) *  cepheid single result test* Anterior Nasal Swab      Status: None   Collection Time: 06/06/23 12:54 PM   Specimen: Anterior Nasal Swab  Result Value Ref Range Status   SARS Coronavirus 2 by RT PCR NEGATIVE NEGATIVE Final    Comment: (NOTE) SARS-CoV-2 target nucleic acids are NOT DETECTED.  The SARS-CoV-2 RNA is generally detectable in upper and lower respiratory specimens during the acute phase of infection. The lowest concentration of SARS-CoV-2 viral copies this assay can detect is 250 copies / mL. A negative result does not preclude SARS-CoV-2 infection and should not be used as the sole basis for treatment or other patient management decisions.  A negative result may occur with improper specimen collection / handling, submission of specimen other than nasopharyngeal swab, presence of viral mutation(s) within the areas targeted by this assay, and inadequate number of viral copies (<250 copies / mL). A negative result must be combined with clinical observations, patient history, and epidemiological information.  Fact Sheet for Patients:   RoadLapTop.co.za  Fact Sheet for Healthcare Providers: http://kim-miller.com/  This test is not yet approved or  cleared by the [place] FDA and has been authorized for detection and/or diagnosis of SARS-CoV-2 by FDA under an Emergency Use Authorization (EUA).  This EUA will remain in effect (meaning this test can be used) for the duration of the COVID-19 declaration under Section 564(b)(1) of the Act, 21 U.S.C. section 360bbb-3(b)(1), unless the authorization is terminated or revoked sooner.  Performed at [facility], [address], [place], [place] [address]    Time coordinating discharge: 39 mins   SIGNED:  Standley Dakins, MD  [facility] 06/09/2023, 11:38 AM How to contact the [facility] Attending or Consulting provider 7A - 7P or covering provider during after hours 7P -7A, for this patient?  Check the care team in [facility] and look for  a) attending/consulting [facility] provider listed and b) the [facility] team listed Log into [email or web address] and use [place]'s universal password to access. If you do not have the password, please contact the hospital operator. Locate the [facility] provider you are looking for under [facility] and page to a number that you can be directly reached. If you still have difficulty reaching the provider, please page the [facility] (Director on Call) for the Hospitalists listed on amion for assistance.

## 2023-06-09 NOTE — Discharge Instructions (Signed)
IMPORTANT INFORMATION: PAY CLOSE ATTENTION  ° °PHYSICIAN DISCHARGE INSTRUCTIONS ° °Follow with Primary care provider  Keatts, Courtney, NP  and other consultants as instructed by your Hospitalist Physician ° °SEEK MEDICAL CARE OR RETURN TO EMERGENCY ROOM IF SYMPTOMS COME BACK, WORSEN OR NEW PROBLEM DEVELOPS  ° °Please note: °You were cared for by a hospitalist during your hospital stay. Every effort will be made to forward records to your primary care provider.  You can request that your primary care provider send for your hospital records if they have not received them.  Once you are discharged, your primary care physician will handle any further medical issues. Please note that NO REFILLS for any discharge medications will be authorized once you are discharged, as it is imperative that you return to your primary care physician (or establish a relationship with a primary care physician if you do not have one) for your post hospital discharge needs so that they can reassess your need for medications and monitor your lab values. ° °Please get a complete blood count and chemistry panel checked by your Primary MD at your next visit, and again as instructed by your Primary MD. ° °Get Medicines reviewed and adjusted: °Please take all your medications with you for your next visit with your Primary MD ° °Laboratory/radiological data: °Please request your Primary MD to go over all hospital tests and procedure/radiological results at the follow up, please ask your primary care provider to get all Hospital records sent to his/her office. ° °In some cases, they will be blood work, cultures and biopsy results pending at the time of your discharge. Please request that your primary care provider follow up on these results. ° °If you are diabetic, please bring your blood sugar readings with you to your follow up appointment with primary care.   ° °Please call and make your follow up appointments as soon as possible.   ° °Also Note  the following: °If you experience worsening of your admission symptoms, develop shortness of breath, life threatening emergency, suicidal or homicidal thoughts you must seek medical attention immediately by calling 911 or calling your MD immediately  if symptoms less severe. ° °You must read complete instructions/literature along with all the possible adverse reactions/side effects for all the Medicines you take and that have been prescribed to you. Take any new Medicines after you have completely understood and accpet all the possible adverse reactions/side effects.  ° °Do not drive when taking Pain medications or sleeping medications (Benzodiazepines) ° °Do not take more than prescribed Pain, Sleep and Anxiety Medications. It is not advisable to combine anxiety,sleep and pain medications without talking with your primary care practitioner ° °Special Instructions: If you have smoked or chewed Tobacco  in the last 2 yrs please stop smoking, stop any regular Alcohol  and or any Recreational drug use. ° °Wear Seat belts while driving.  Do not drive if taking any narcotic, mind altering or controlled substances or recreational drugs or alcohol.  ° ° ° ° ° °

## 2023-06-09 NOTE — TOC Transition Note (Signed)
Transition of Care [facility]) - CM/SW Discharge Note   Patient Details  Name: Heather Mckinney MRN: [id number] Date of Birth: 01/01/1971  Transition [facility]) CM/SW Contact:  Villa Herb, LCSWA Phone Number: 06/09/2023, 10:34 AM  Clinical Narrative:    CSW spoke with pt about MD ordering neb machine and meds. CSW spoke with pt about interest, she is agreeable to referral to [facility]. CSW spoke to [place] with [employer] who confirmed they will be able to get neb ordered and delivered to pts home. TOC signing off.   Final next level of care: Home/Self Care Barriers to Discharge: Barriers Resolved   Patient Goals and CMS Choice CMS Medicare.gov Compare Post Acute Care list provided to:: Patient Choice offered to / list presented to : Patient  Discharge Placement                         Discharge Plan and Services Additional resources added to the After Visit Summary for                  DME Arranged: Nebulizer/meds DME Agency: [facility] Date DME Agency Contacted: 06/09/23   Representative spoke with at DME Agency: Marthann Schiller            Social Determinants of Health (SDOH) Interventions SDOH Screenings   Food Insecurity: No Food Insecurity (06/06/2023)  Housing: Low Risk  (06/06/2023)  Transportation Needs: No Transportation Needs (06/06/2023)  Utilities: Not At Risk (06/06/2023)  Depression (PHQ2-9): Low Risk  (01/27/2023)  Tobacco Use: Medium Risk (06/06/2023)     Readmission Risk Interventions     No data to display

## 2023-06-10 LAB — HIV ANTIBODY (ROUTINE TESTING W REFLEX): HIV Screen 4th Generation wRfx: NONREACTIVE

## 2023-06-18 ENCOUNTER — Other Ambulatory Visit (HOSPITAL_COMMUNITY): Payer: Self-pay | Admitting: Adult Health Nurse Practitioner

## 2023-06-18 DIAGNOSIS — Z1231 Encounter for screening mammogram for malignant neoplasm of breast: Secondary | ICD-10-CM

## 2024-01-19 ENCOUNTER — Emergency Department (HOSPITAL_COMMUNITY): Payer: BC Managed Care – PPO

## 2024-01-19 ENCOUNTER — Emergency Department (HOSPITAL_COMMUNITY)
Admission: EM | Admit: 2024-01-19 | Discharge: 2024-01-19 | Disposition: A | Discharge status: Home / Self Care | Payer: BC Managed Care – PPO | Attending: Emergency Medicine | Admitting: Emergency Medicine

## 2024-01-19 ENCOUNTER — Other Ambulatory Visit: Payer: Self-pay

## 2024-01-19 ENCOUNTER — Encounter (HOSPITAL_COMMUNITY): Payer: Self-pay

## 2024-01-19 DIAGNOSIS — M79671 Pain in right foot: Secondary | ICD-10-CM | POA: Diagnosis present

## 2024-01-19 DIAGNOSIS — M19071 Primary osteoarthritis, right ankle and foot: Secondary | ICD-10-CM | POA: Diagnosis not present

## 2024-01-19 MED ORDER — ACETAMINOPHEN 500 MG PO TABS
1000.0000 mg | ORAL_TABLET | Freq: Once | ORAL | Status: AC
  Administered 2024-01-19: 1000 mg via ORAL
  Filled 2024-01-19: qty 2

## 2024-01-19 MED ORDER — ZIKS ARTHRITIS PAIN RELIEF 0.025-1-12 % EX CREA: TOPICAL_CREAM | CUTANEOUS | 0 refills | Status: AC

## 2024-01-19 MED ORDER — HYDROMORPHONE HCL 1 MG/ML IJ SOLN
INTRAMUSCULAR | Status: AC
  Filled 2024-01-19: qty 1

## 2024-01-19 MED ORDER — ONDANSETRON HCL 4 MG/2ML IJ SOLN
INTRAMUSCULAR | Status: AC
  Filled 2024-01-19: qty 2

## 2024-01-19 NOTE — ED Triage Notes (Signed)
Pt arrived via POV c/o right foot pain since this past Friday. Pt reports Hx of a stress fracture in 07/2022. Pt denies recent injuries to her foot, but does say she works on her feet a lot.

## 2024-01-19 NOTE — Discharge Instructions (Addendum)
Evaluation today revealed you have osteoarthritis in your right foot. This could be contributing to your symptoms. I sent capsaicin to your pharmacy.  Please follow-up with your PCP.  No fractures or dislocations noted on x-ray today.

## 2024-01-19 NOTE — ED Provider Notes (Signed)
[place] EMERGENCY DEPARTMENT AT [facility] Provider Note   CSN: [id number] Arrival date & time: 01/19/24  1544     History  Chief Complaint  Patient presents with   Foot Pain   HPI Heather Mckinney is a 53 y.o. female presenting for right foot pain.  Started this past Friday.  Does report history of a stress fracture in the right foot in August 2023.  Denies any recent trauma to the foot.  States this feels swollen.  Denies any swelling or pain in the lower legs.  States she does work at [employer] station and is on her feet many hours a day.  Pain is worse with ambulation.  Has been taking Tylenol for pain.  Reports history of gastric bypass surgery and states that she is not able to take ibuprofen because of it.   Foot Pain       Home Medications Prior to Admission medications   Medication Sig Start Date End Date Taking? Authorizing Provider  Capsaicin-Menthol-Methyl Sal (CAPSAICIN-METHYL SAL-MENTHOL) 0.025-1-12 % CREA Please apply to the area of pain on your right foot 3 times a day. 01/19/24 01/30/24 Yes Gareth Eagle, PA-C  albuterol (VENTOLIN HFA) 108 (90 Base) MCG/ACT inhaler Inhale 1-2 puffs into the lungs every 6 (six) hours as needed for wheezing or shortness of breath. 06/09/23   Johnson, Clanford L, MD  calcium carbonate (OS-CAL) 1250 (500 Ca) MG chewable tablet Chew 1 tablet by mouth daily.    [provider]  dextromethorphan (DELSYM) 30 MG/5ML liquid Take 5 mLs (30 mg total) by mouth 2 (two) times daily as needed for cough. 06/09/23   Johnson, Clanford L, MD  DULoxetine (CYMBALTA) 20 MG capsule Take 20 mg by mouth daily.    [provider]  gabapentin (NEURONTIN) 300 MG capsule Take 300 mg by mouth 2 (two) times daily.    [provider]  ipratropium-albuterol (DUONEB) 0.5-2.5 (3) MG/3ML SOLN Take 3 mLs by nebulization every 4 (four) hours as needed (wheezing, coughing, SOB). 06/09/23   Johnson, Clanford L, MD  methocarbamol  (ROBAXIN-750) 750 MG tablet Take 1 tablet (750 mg total) by mouth every 6 (six) hours as needed for muscle spasms. 04/16/23   Stechschulte, Hyman Hopes, MD  Multiple Vitamin (MULTIVITAMIN) capsule Take 1 capsule by mouth daily.    [provider]  ondansetron (ZOFRAN-ODT) 4 MG disintegrating tablet Take 1 tablet (4 mg total) by mouth every 6 (six) hours as needed for nausea or vomiting. 04/16/23   Stechschulte, Hyman Hopes, MD  pantoprazole (PROTONIX) 40 MG tablet Take 1 tablet (40 mg total) by mouth daily. 04/16/23   Stechschulte, Hyman Hopes, MD      Allergies    Ephedrine and Aspirin    Review of Systems   See HPI for pertinent positives  Physical Exam Updated Vital Signs BP (!) 177/89   Pulse 65   Temp 99 F (37.2 C) (Oral)   Resp 16   Ht 5\' 8"  (1.727 m)   Wt 100.2 kg   LMP 12/25/2019   SpO2 100%   BMI 33.60 kg/m  Physical Exam Constitutional:      Appearance: Normal appearance.  HENT:     Head: Normocephalic.     Nose: Nose normal.  Eyes:     Conjunctiva/sclera: Conjunctivae normal.  Pulmonary:     Effort: Pulmonary effort is normal.  Musculoskeletal:     Right foot: Normal. Normal range of motion and normal capillary refill. No tenderness. Normal pulse.  Left foot: Normal. Normal range of motion and normal capillary refill. No tenderness. Normal pulse.  Neurological:     Mental Status: She is alert.  Psychiatric:        Mood and Affect: Mood normal.     ED Results / Procedures / Treatments   Labs (all labs ordered are listed, but only abnormal results are displayed) Labs Reviewed - No data to display  EKG None  Radiology DG Foot Complete Right Result Date: 01/19/2024 CLINICAL DATA:  Right foot pain EXAM: RIGHT FOOT COMPLETE - 3+ VIEW COMPARISON:  07/08/2022 FINDINGS: Similar moderately severe right first MTP joint degenerative osteoarthritis with joint space loss, sclerosis and bony spurring. No acute osseous finding, displaced fracture, malalignment, or  additional joint abnormality. Soft tissues unremarkable. IMPRESSION: 1. Similar right first MTP joint degenerative osteoarthritis. 2. No acute finding by plain radiography. Electronically Signed   By: Judie Petit.  Shick M.D.   On: 01/19/2024 16:15    Procedures Procedures    Medications Ordered in ED Medications  acetaminophen (TYLENOL) tablet 1,000 mg (has no administration in time range)    ED Course/ Medical Decision Making/ A&P                                 Medical Decision Making Amount and/or Complexity of Data Reviewed Radiology: ordered.   53 year old well-appearing female presenting for right foot pain. Exam was unremarkable.  DDx includes fracture, dislocation, infected joint, other.  I personally reviewed and interpreted x-ray which revealed first right MTP joint degenerative arthritis otherwise no acute findings.  This is finding could be the source of her symptoms.  Advised her to follow-up with her PCP.  Treated her with Tylenol.  Sent topical capsaicin to her pharmacy.  Vital stable.  Discharged good condition.        Final Clinical Impression(s) / ED Diagnoses Final diagnoses:  Osteoarthritis of right foot, unspecified osteoarthritis type    Rx / DC Orders ED Discharge Orders          Ordered    Capsaicin-Menthol-Methyl Sal (CAPSAICIN-METHYL SAL-MENTHOL) 0.025-1-12 % CREA        01/19/24 1624              Gareth Eagle, PA-C 01/19/24 1631    Rondel Baton, MD 01/21/24 1308

## 2024-01-23 ENCOUNTER — Telehealth: Payer: Self-pay

## 2024-01-23 NOTE — Progress Notes (Signed)
Transition Care Management Unsuccessful Follow-up Telephone Call  Date of [facility] and from where:  [facility]  Attempts:  1st Attempt  Reason for unsuccessful TCM follow-up call:  No answer/busy   Lenard Forth [facility] Guide, Phone: [phone] Fax: [phone] Website: [email or web address]

## 2024-01-26 ENCOUNTER — Telehealth: Payer: Self-pay

## 2024-01-26 NOTE — Progress Notes (Signed)
Transition Care Management Unsuccessful Follow-up Telephone Call  Date of discharge and from where:  [facility]  Attempts:  2nd Attempt  Reason for unsuccessful TCM follow-up call:  No answer/busy   Lenard Forth [facility] Guide, Phone: [phone] Fax: [phone] Website: [email or web address]
